# Patient Record
Sex: Female | Born: 2000 | Race: White | Hispanic: No | Marital: Single | Smoking: Never smoker
Health system: Southern US, Community
[De-identification: ages and names within clinical notes are randomized; demographics above are authoritative.]

## PROBLEM LIST (undated history)

## (undated) DIAGNOSIS — L659 Nonscarring hair loss, unspecified: Secondary | ICD-10-CM

## (undated) DIAGNOSIS — J45909 Unspecified asthma, uncomplicated: Secondary | ICD-10-CM

## (undated) DIAGNOSIS — J302 Other seasonal allergic rhinitis: Secondary | ICD-10-CM

## (undated) DIAGNOSIS — F4323 Adjustment disorder with mixed anxiety and depressed mood: Secondary | ICD-10-CM

## (undated) DIAGNOSIS — E663 Overweight: Secondary | ICD-10-CM

## (undated) HISTORY — DX: Overweight: E66.3

## (undated) HISTORY — DX: Unspecified asthma, uncomplicated: J45.909

## (undated) HISTORY — DX: Adjustment disorder with mixed anxiety and depressed mood: F43.23

## (undated) HISTORY — DX: Other seasonal allergic rhinitis: J30.2

---
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
## 2018-04-02 ENCOUNTER — Ambulatory Visit: Payer: BC Managed Care – PPO | Admitting: Pediatrics

## 2018-08-04 ENCOUNTER — Encounter: Payer: Self-pay | Admitting: Pediatrics

## 2018-09-14 ENCOUNTER — Ambulatory Visit: Payer: BC Managed Care – PPO | Admitting: Pediatrics

## 2018-09-15 ENCOUNTER — Encounter: Payer: Self-pay | Admitting: Pediatrics

## 2018-09-15 ENCOUNTER — Telehealth: Payer: Self-pay

## 2018-09-15 ENCOUNTER — Ambulatory Visit (INDEPENDENT_AMBULATORY_CARE_PROVIDER_SITE_OTHER): Payer: BC Managed Care – PPO | Admitting: Pediatrics

## 2018-09-15 VITALS — BP 130/70 | Ht 66.14 in | Wt 243.4 lb

## 2018-09-15 DIAGNOSIS — Z0001 Encounter for general adult medical examination with abnormal findings: Secondary | ICD-10-CM | POA: Diagnosis not present

## 2018-09-15 DIAGNOSIS — Z6839 Body mass index (BMI) 39.0-39.9, adult: Secondary | ICD-10-CM | POA: Diagnosis not present

## 2018-09-15 DIAGNOSIS — L659 Nonscarring hair loss, unspecified: Secondary | ICD-10-CM

## 2018-09-15 DIAGNOSIS — Z23 Encounter for immunization: Secondary | ICD-10-CM

## 2018-09-15 NOTE — Progress Notes (Signed)
[id number] Routine Well-Adolescent Visit  Amber Braun's personal or confidential phone number: [phone]  PCP: Amare Bail, Kyra Manges, MD   History was provided by the patient.  Amber Braun is a 17 y.o. female who is here for well exam.   Current concerns: has hair loss through the top of her head. Does not seem to be falling out. She had prior episode when she was in 10th grade due to stress, does not feel stressed now, has completed her first semester of college  No Known Allergies  Current Outpatient Medications on File Prior to Visit  Medication Sig Dispense Refill  . albuterol (PROVENTIL HFA;VENTOLIN HFA) 108 (90 Base) MCG/ACT inhaler Inhale 2 puffs into the lungs every 4 (four) hours as needed for wheezing. (Patient not taking: Reported on 12/01/2017) 1 Inhaler 2   No current facility-administered medications on file prior to visit.     Past Medical History:  Diagnosis Date  . Adjustment disorder with mixed anxiety and depressed mood 02/17/2013  . Adjustment disorder with mixed anxiety and depressed mood 02/17/2013  . Overweight(278.02) 02/17/2013  . Seasonal allergic rhinitis 02/17/2013  . Unspecified asthma(493.90) 02/17/2013    History reviewed. No pertinent surgical history.   ROS:     Constitutional  Afebrile, normal appetite, normal activity.   Opthalmologic  no irritation or drainage.   ENT  no rhinorrhea or congestion , no sore throat, no ear pain. Cardiovascular  No chest pain Respiratory  no cough , wheeze or chest pain.  Gastrointestinal  no abdominal pain, nausea or vomiting, bowel movements normal.     Genitourinary  no urgency, frequency or dysuria.   Musculoskeletal  no complaints of pain, no injuries.   Dermatologic  no rashes or lesions has hair thinning Neurologic - no significant history of headaches, no weakness  family history includes Cancer in her maternal grandfather; Diabetes in her paternal grandmother; Heart disease in her paternal grandfather and  paternal grandmother; Hypertension in her maternal grandfather and maternal grandmother.    Adolescent Assessment:  Confidentiality was discussed with the patient and if applicable, with caregiver as well.  Home and Environment:  Social History   Social History Narrative   Lives with parents, has older married sister   No smokers    Sports/Exercise:  regularly participates in sports -swims when she can  Education and Employment:  School Status: in  [employer] major School History: School attendance is regular. Work:  Activities:   parent not present confidentiality discussed:   Patient reports being comfortable and safe at school and at home? Yes  Smoking: no Secondhand smoke exposure? no Drugs/EtOH: no   Sexuality:  -Menarche: age - females:  last menses: 12/2  - Sexually active? no  - sexual partners in last year:  - contraception use: abstinence - Last STI Screening: 03/2017  - Violence/Abuse:   Mood: Suicidality and Depression: past history, doing well now Weapons:   Screenings:  PHQ-9 completed and results indicated no sigificant issues , score 3   Hearing Screening   '125Hz'$  '250Hz'$  '500Hz'$  '1000Hz'$  '2000Hz'$  '3000Hz'$  '4000Hz'$  '6000Hz'$  '8000Hz'$   Right ear:   '20 20 20 20 20    '$ Left ear:   '20 20 20 20 20      '$ Visual Acuity Screening   Right eye Left eye Both eyes  Without correction:     With correction: 20/20 20/20       Physical Exam:  BP 130/70   Ht 5' 6.14" (1.68 m)   Wt 243  lb 6.4 oz (110.4 kg)   BMI 39.12 kg/m   Weight: >99 %ile (Z= 2.42) based on CDC (Girls, 2-20 Years) weight-for-age data using vitals from 09/15/2018. Normalized weight-for-stature data available only for age 63 to 5 years.  Height: 77 %ile (Z= 0.73) based on CDC (Girls, 2-20 Years) Stature-for-age data based on Stature recorded on 09/15/2018.  Blood pressure percentiles are not available for patients who are 18 years or older.    Objective:         General alert in NAD  Derm    no rashes or lesions  Head Normocephalic, atraumatic                    Eyes Normal, no discharge  Ears:   TMs normal bilaterally  Nose:   patent normal mucosa, turbinates normal, no rhinorhea  Oral cavity  moist mucous membranes, no lesions  Throat:   normal tonsils, without exudate or erythema  Neck supple FROM  Lymph:   . no significant cervical adenopathy  Lungs:  clear with equal breath sounds bilaterally  Breast Tanner 5  Heart:   regular rate and rhythm, no murmur  Abdomen:  soft nontender no organomegaly or masses  GU:  normal female  back No deformity no scoliosis  Extremities:   no deformity,  Neuro:  intact no focal defects         Assessment/Plan:  1. Encounter for general adult medical examination with abnormal findings   2. Pediatric body mass index (BMI) of greater than or equal to 95th percentile for age Weight is stable congratulated her on avoiding the "freshman fifteen" has actually lost 4 # since last visit she will be swimming again next semester - Lipid panel - Hemoglobin A1c - TSH - Comprehensive metabolic panel - VITAMIN D 25 Hydroxy (Vit-D Deficiency, Fractures) - T4, free  3. 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 Need for vaccination  - Hepatitis A vaccine pediatric / adolescent 2 dose IM - HPV 9-valent vaccine,Recombinat - Meningococcal B, OMV (Bexsero) - Flu Vaccine QUAD 6+ mos PF IM (Fluarix Quad PF) - GC/Chlamydia Probe Amp([facility])  BMI: is not appropriate for age  Counseling completed for all of the following vaccine components  Orders Placed This Encounter  Procedures  . GC/Chlamydia Probe Amp([facility])  . Hepatitis A vaccine pediatric / adolescent 2 dose IM  . HPV 9-valent vaccine,Recombinat  . Meningococcal B, OMV (Bexsero)  . Flu Vaccine QUAD 6+ mos PF IM (Fluarix Quad PF)  . Lipid panel  . Hemoglobin A1c  . TSH  . Comprehensive metabolic  panel  . VITAMIN D 25 Hydroxy (Vit-D Deficiency, Fractures)  . T4, free  . CBC with Differential/Platelet  . Sed Rate (ESR)  . Ambulatory referral to Dermatology    Return in 6 months (on 03/17/2019).  Elizbeth Squires, MD

## 2018-09-15 NOTE — Patient Instructions (Signed)
Preventive Care for Amber Braun, Female The transition to life after high school as a young adult can be a stressful time with many changes. You may start seeing a primary care physician instead of a pediatrician. This is the time when your health care becomes your responsibility. Preventive care refers to lifestyle choices and visits with your health care provider that can promote health and wellness. What does preventive care include?  A yearly physical exam. This is also called an annual wellness visit.  Dental exams once or twice a year.  Routine eye exams. Ask your health care provider how often you should have your eyes checked.  Personal lifestyle choices, including: ? Daily care of your teeth and gums. ? Regular physical activity. ? Eating a healthy diet. ? Avoiding tobacco and drug use. ? Avoiding or limiting alcohol use. ? Practicing safe sex. ? Taking vitamin and mineral supplements as recommended by your health care provider. What happens during an annual wellness visit? Preventive care starts with a yearly visit to your primary care physician. The services and screenings done by your health care provider during your annual wellness visit will depend on your overall health, lifestyle risk factors, and family history of disease. Counseling Your health care provider may ask you questions about:  Past medical problems and your family's medical history.  Medicines or supplements you take.  Health insurance and access to health care.  Alcohol, tobacco, and drug use.  Your safety at home, work, or school.  Access to firearms.  Emotional well-being and how you cope with stress.  Relationship well-being.  Diet, exercise, and sleep habits.  Your sexual health and activity.  Your methods of birth control.  Your menstrual cycle.  Your pregnancy history.  Screening You may have the following tests or measurements:  Height, weight, and BMI.  Blood  pressure.  Lipid and cholesterol levels.  Tuberculosis skin test.  Skin exam.  Vision and hearing tests.  Screening test for hepatitis.  Screening tests for sexually transmitted diseases (STDs), if you are at risk.  BRCA-related cancer screening. This may be done if you have a family history of breast, ovarian, tubal, or peritoneal cancers.  Pelvic exam and Pap test. This may be done every 3 years starting at age 35.  Vaccines Your health care provider may recommend certain vaccines, such as:  Influenza vaccine. This is recommended every year.  Tetanus, diphtheria, and acellular pertussis (Tdap, Td) vaccine. You may need a Td booster every 10 years.  Varicella vaccine. You may need this if you have not been vaccinated.  HPV vaccine. If you are 25 or younger, you may need three doses over 6 months.  Measles, mumps, and rubella (MMR) vaccine. You may need at least one dose of MMR. You may also need a second dose.  Pneumococcal 13-valent conjugate (PCV13) vaccine. You may need this if you have certain conditions and were not previously vaccinated.  Pneumococcal polysaccharide (PPSV23) vaccine. You may need one or two doses if you smoke cigarettes or if you have certain conditions.  Meningococcal vaccine. One dose is recommended if you are age 24-21 years and a first-year college student living in a residence hall, or if you have one of several medical conditions. You may also need additional booster doses.  Hepatitis A vaccine. You may need this if you have certain conditions or if you travel or work in places where you may be exposed to hepatitis A.  Hepatitis B vaccine. You may need this if  you have certain conditions or if you travel or work in places where you may be exposed to hepatitis B.  Haemophilus influenzae type b (Hib) vaccine. You may need this if you have certain risk factors.  Talk to your health care provider about which screenings and vaccines you need and how  often you need them. What steps can I take to develop healthy behaviors?  Have regular preventive health care visits with your primary care physician and dentist.  Eat a healthy diet.  Drink enough fluid to keep your urine clear or pale yellow.  Stay active. Exercise at least 30 minutes 5 or more days of the week.  Use alcohol responsibly.  Maintain a healthy weight.  Do not use any products that contain nicotine, such as cigarettes, chewing tobacco, and e-cigarettes. If you need help quitting, ask your health care provider.  Do not use drugs.  Practice safe sex.  Use birth control (contraception) to prevent unwanted pregnancy. If you plan to become pregnant, see your health care provider for a pre-conception visit.  Find healthy ways to manage stress. How can I protect myself from injury? Injuries from violence or accidents are the leading cause of death among young adults and can often be prevented. Take these steps to help protect yourself:  Always wear your seat belt while driving or riding in a vehicle.  Do not drive if you have been drinking alcohol. Do not ride with someone who has been drinking.  Do not drive when you are tired or distracted. Do not text while driving.  Wear a helmet and other protective equipment during sports activities.  If you have firearms in your house, make sure you follow all gun safety procedures.  Seek help if you have been bullied, physically abused, or sexually abused.  Use the Internet responsibly to avoid dangers such as online bullying and online sexual predators.  What can I do to cope with stress? Young adults may face many new challenges that can be stressful, such as finding a job, going to college, moving away from home, managing money, being in a relationship, getting married, and having children. To manage stress:  Avoid known stressful situations when you can.  Exercise regularly.  Find a stress-reducing activity that  works best for you. Examples include meditation, yoga, listening to music, or reading.  Spend time in nature.  Keep a journal to write about your stress and how you respond.  Talk to your health care provider about stress. He or she may suggest counseling.  Spend time with supportive friends or family.  Do not cope with stress by: ? Drinking alcohol or using drugs. ? Smoking cigarettes. ? Eating.  Where can I get more information? Learn more about preventive care and healthy habits from:  [place] and Gynecologists: KaraokeExchange.nl  U.S. Probation officer Task Force: StageSync.si  National Adolescent and [place]: StrategicRoad.nl  American Academy of Pediatrics Bright Futures: https://brightfutures.MemberVerification.co.za  Society for Adolescent Health and Medicine: MoralBlog.co.za.aspx  PodExchange.nl: ToyLending.fr  This information is not intended to replace advice given to you by your health care provider. Make sure you discuss any questions you have with your health care provider. Document Released: 02/07/2016 Document Revised: 02/28/2016 Document Reviewed: 02/07/2016 Elsevier Interactive Patient Education  [employer].

## 2018-09-15 NOTE — Telephone Encounter (Signed)
CALLED Lamiah to let her know orders are ready for pickup so that she is able to get blood drawn for labs. Told her anytime after 2. Pt did understand.

## 2018-09-17 LAB — GC/CHLAMYDIA PROBE AMP
Chlamydia trachomatis, NAA: NEGATIVE
Neisseria gonorrhoeae by PCR: NEGATIVE

## 2018-09-18 LAB — COMPREHENSIVE METABOLIC PANEL
ALT: 21 IU/L (ref 0–32)
AST: 17 IU/L (ref 0–40)
Albumin/Globulin Ratio: 1.5 (ref 1.2–2.2)
Albumin: 3.9 g/dL (ref 3.5–5.5)
Alkaline Phosphatase: 66 IU/L (ref 43–101)
BUN/Creatinine Ratio: 15 (ref 9–23)
BUN: 12 mg/dL (ref 6–20)
Bilirubin Total: 0.3 mg/dL (ref 0.0–1.2)
CO2: 23 mmol/L (ref 20–29)
Calcium: 9.5 mg/dL (ref 8.7–10.2)
Chloride: 99 mmol/L (ref 96–106)
Creatinine, Ser: 0.81 mg/dL (ref 0.57–1.00)
Globulin, Total: 2.6 g/dL (ref 1.5–4.5)
Glucose: 79 mg/dL (ref 65–99)
Potassium: 4.2 mmol/L (ref 3.5–5.2)
Sodium: 138 mmol/L (ref 134–144)
Total Protein: 6.5 g/dL (ref 6.0–8.5)

## 2018-09-18 LAB — HEMOGLOBIN A1C
Est. average glucose Bld gHb Est-mCnc: 114 mg/dL
Hgb A1c MFr Bld: 5.6 % (ref 4.8–5.6)

## 2018-09-18 LAB — CBC
Hematocrit: 41.8 % (ref 34.0–46.6)
Hemoglobin: 13.1 g/dL (ref 11.1–15.9)
MCH: 24.7 pg — ABNORMAL LOW (ref 26.6–33.0)
MCHC: 31.3 g/dL — ABNORMAL LOW (ref 31.5–35.7)
MCV: 79 fL (ref 79–97)
Platelets: 359 10*3/uL (ref 150–450)
RBC: 5.3 x10E6/uL — ABNORMAL HIGH (ref 3.77–5.28)
RDW: 14.4 % (ref 12.3–15.4)
WBC: 8.1 10*3/uL (ref 3.4–10.8)

## 2018-09-18 LAB — VITAMIN D 25 HYDROXY (VIT D DEFICIENCY, FRACTURES): Vit D, 25-Hydroxy: 21.9 ng/mL — ABNORMAL LOW (ref 30.0–100.0)

## 2018-09-18 LAB — T4, FREE: Free T4: 1.22 ng/dL (ref 0.93–1.60)

## 2018-09-18 LAB — LIPID PANEL
Chol/HDL Ratio: 4 ratio (ref 0.0–4.4)
Cholesterol, Total: 158 mg/dL (ref 100–169)
HDL: 40 mg/dL (ref >39)
LDL Calculated: 103 mg/dL (ref 0–109)
Triglycerides: 73 mg/dL (ref 0–89)
VLDL Cholesterol Cal: 15 mg/dL (ref 5–40)

## 2018-09-18 LAB — TSH: TSH: 2.18 u[IU]/mL (ref 0.450–4.500)

## 2018-10-11 ENCOUNTER — Telehealth: Payer: Self-pay

## 2018-10-11 NOTE — Telephone Encounter (Signed)
Amber Braun called in wanting to know her lab results from back in December, told her everything looked normal except her vitamin D level, that she should take an OTC vitamin D supplement. Verbalized understanding.

## 2019-03-01 ENCOUNTER — Encounter: Payer: Self-pay | Admitting: "Endocrinology

## 2019-03-01 ENCOUNTER — Ambulatory Visit: Payer: BC Managed Care – PPO | Admitting: "Endocrinology

## 2019-03-01 ENCOUNTER — Other Ambulatory Visit: Payer: Self-pay

## 2019-03-01 VITALS — BP 107/75 | HR 82 | Ht 66.0 in | Wt 259.0 lb

## 2019-03-01 DIAGNOSIS — L659 Nonscarring hair loss, unspecified: Secondary | ICD-10-CM | POA: Insufficient documentation

## 2019-03-01 NOTE — Progress Notes (Signed)
Endocrinology Consult Note                                            03/01/2019, 2:21 PM   Subjective:    Patient ID: Amber Braun, female    DOB: 2001-02-27, PCP McDonell, Kyra Manges, MD   Past Medical History:  Diagnosis Date  . Adjustment disorder with mixed anxiety and depressed mood 02/17/2013  . Adjustment disorder with mixed anxiety and depressed mood 02/17/2013  . Overweight(278.02) 02/17/2013  . Seasonal allergic rhinitis 02/17/2013  . Unspecified asthma(493.90) 02/17/2013   History reviewed. No pertinent surgical history. Social History   Socioeconomic History  . Marital status: Single    Spouse name: Not on file  . Number of children: Not on file  . Years of education: Not on file  . Highest education level: Not on file  Occupational History  . Not on file  Social Needs  . Financial resource strain: Not on file  . Food insecurity:    Worry: Not on file    Inability: Not on file  . Transportation needs:    Medical: Not on file    Non-medical: Not on file  Tobacco Use  . Smoking status: Never Smoker  . Smokeless tobacco: Never Used  Substance and Sexual Activity  . Alcohol use: No  . Drug use: Not on file  . Sexual activity: Not on file  Lifestyle  . Physical activity:    Days per week: Not on file    Minutes per session: Not on file  . Stress: Not on file  Relationships  . Social connections:    Talks on phone: Not on file    Gets together: Not on file    Attends religious service: Not on file    Active member of club or organization: Not on file    Attends meetings of clubs or organizations: Not on file    Relationship status: Not on file  Other Topics Concern  . Not on file  Social History Narrative   Lives with parents, has older married sister   No smokers   Family History  Problem Relation Age of Onset  . Hypertension Maternal Grandmother   . Hypertension Maternal Grandfather   . Cancer Maternal Grandfather   . Diabetes  Paternal Grandmother   . Heart disease Paternal Grandmother   . Heart disease Paternal Grandfather    Outpatient Encounter Medications as of 03/01/2019  Medication Sig  . ferrous sulfate 325 (65 FE) MG EC tablet Take 325 mg by mouth daily with breakfast.  . fexofenadine (ALLEGRA) 180 MG tablet Take 180 mg by mouth daily.  Marland Kitchen spironolactone (ALDACTONE) 25 MG tablet Take 25 mg by mouth daily.  Matilde Bash 1/35 tablet daily.  Marland Kitchen albuterol (PROVENTIL HFA;VENTOLIN HFA) 108 (90 Base) MCG/ACT inhaler Inhale 2 puffs into the lungs every 4 (four) hours as needed for wheezing. (Patient not taking: Reported on 12/01/2017)   No facility-administered encounter medications on file as of 03/01/2019.    ALLERGIES: No Known Allergies  VACCINATION STATUS: Immunization History  Administered Date(s) Administered  . DTaP 12/02/1999, 01/30/2000, 04/01/2000, 10/07/2000, 05/15/2005  . HPV 9-valent 09/15/2018  . Hepatitis A 02/17/2017  . Hepatitis A, Ped/Adol-2 Dose 09/15/2018  . Hepatitis B Jan 11, 2001, 12/02/1999, 07/01/2000  . HiB (PRP-OMP) 12/02/1999, 01/30/2000, 04/01/2000, 10/07/2000  . Hpv 12/21/2017, 02/18/2018  .  IPV 12/02/1999, 01/30/2000, 04/30/2001, 05/15/2005  . Influenza Nasal 08/18/2005  . Influenza Whole 10/29/2006, 07/06/2008, 08/07/2009, 07/04/2010, 09/17/2011, 06/23/2012  . Influenza,inj,Quad PF,6+ Mos 09/15/2018  . MMR 10/07/2000, 05/15/2005  . Meningococcal B, OMV 09/15/2018  . Meningococcal Conjugate 03/18/2017  . Pneumococcal Conjugate-13 07/01/2000, 10/07/2000, 12/01/2001  . Td 04/23/2011  . Tdap 04/23/2011  . Varicella 04/27/2001, 03/18/2017    HPI Amber Braun is 18 y.o. female who presents today with a medical history as above. she is being seen in consultation for hair loss requested by McDonell, Kyra Manges, MD.  she has been dealing with hair loss for 2 to 3 years, loss of scalp hair on the top.  She has been following with dermatology where she is receiving prescription for  spironolactone.  Biopsy was performed on her scalp suggesting a possibility of androgenetic hair loss.  She does not have hormone work-up so far.  She is on oral contraceptive pills.  She reports that her hair loss is significantly slowing down after she was started on Spironolactone.  She denies any history of hirsutism.  Her menstrual cycles are normalized at this time.  She does not have any family history of endocrine malignancies.  -She has been gaining weight progressively, despite some efforts to manage her carbs.  Review of Systems  Constitutional: + recent weight gain, no fatigue, no subjective hyperthermia, no subjective hypothermia Eyes: no blurry vision, no xerophthalmia ENT: no sore throat, no nodules palpated in throat, no dysphagia/odynophagia, no hoarseness Cardiovascular: no Chest Pain, no Shortness of Breath, no palpitations, no leg swelling Respiratory: no cough, no shortness of breath Gastrointestinal: no Nausea/Vomiting/Diarhhea Musculoskeletal: no muscle/joint aches Skin: no rashes, + hair loss Neurological: no tremors, no numbness, no tingling, no dizziness Psychiatric: no depression, no anxiety  Objective:    BP 107/75   Pulse 82   Ht _0  (1.676 m)   Wt 259 lb (117.5 kg)   BMI 41.80 kg/m   Wt Readings from Last 3 Encounters:  03/01/19 259 lb (117.5 kg) (>99 %, Z= 2.57)*  09/15/18 243 lb 6.4 oz (110.4 kg) (>99 %, Z= 2.42)*  12/01/17 247 lb 4 oz (112.2 kg) (>99 %, Z= 2.43)*   * Growth percentiles are based on CDC (Girls, 2-20 Years) data.    Physical Exam  Constitutional:  + obese for height, not in acute distress, normal state of mind Eyes: PERRLA, EOMI, no exophthalmos ENT: moist mucous membranes, no gross thyromegaly, no gross cervical lymphadenopathy Cardiovascular: normal precordial activity, Regular Rate and Rhythm, no Murmur/Rubs/Gallops Respiratory:  adequate breathing efforts, no gross chest deformity, Clear to auscultation  bilaterally Gastrointestinal: abdomen soft, Non -tender, No distension, Bowel Sounds present, no gross organomegaly Musculoskeletal: no gross deformities, strength intact in all four extremities Skin: moist, warm, no rashes,  + thinned scalp hair on vertex. Neurological: no tremor with outstretched hands, Deep tendon reflexes normal in bilateral lower extremities.  CMP ( most recent) CMP     Component Value Date/Time   NA 138 09/17/2018 0849   K 4.2 09/17/2018 0849   CL 99 09/17/2018 0849   CO2 23 09/17/2018 0849   GLUCOSE 79 09/17/2018 0849   GLUCOSE 85 02/17/2013 0931   BUN 12 09/17/2018 0849   CREATININE 0.81 09/17/2018 0849   CREATININE 0.73 02/17/2013 0931   CALCIUM 9.5 09/17/2018 0849   PROT 6.5 09/17/2018 0849   ALBUMIN 3.9 09/17/2018 0849   AST 17 09/17/2018 0849   ALT 21 09/17/2018 0849   ALKPHOS 66 09/17/2018  0849   BILITOT 0.3 09/17/2018 0849     Diabetic Labs (most recent): Lab Results  Component Value Date   HGBA1C 5.6 09/17/2018   HGBA1C 5.4 05/06/2017   HGBA1C 5.4 02/17/2013     Lipid Panel ( most recent) Lipid Panel     Component Value Date/Time   CHOL 158 09/17/2018 0849   TRIG 73 09/17/2018 0849   HDL 40 09/17/2018 0849   CHOLHDL 4.0 09/17/2018 0849   LDLCALC 103 09/17/2018 0849      Lab Results  Component Value Date   TSH 2.180 09/17/2018   TSH 1.750 05/06/2017   TSH 1.046 02/17/2013   FREET4 1.22 09/17/2018   FREET4 1.28 02/17/2013      Assessment & Plan:   1. Hair loss - Amber Braun  is being seen at a kind request of McDonell, Kyra Manges, MD. - I have reviewed her available endocrine records and clinically evaluated the patient. - Based on these reviews, she does not have sufficient information to proceed with any definitive treatment plan.  She does not have frontal baldness nor temporal hair loss.  Although she is obese, she does not have features of PCOS.  -Subjectively, she describes response to treatment with  spironolactone as far as her hair loss.  She has normal menstrual cycles.  She is on oral contraceptive pills which will make it impractical to measure testosterone at this time.   -Suspicion for endocrine malignancy is low at this time.  However, I will proceed to obtain DHEA and DHEA-S on fasting morning sample of blood in the next few days.  She will return in 2 weeks for reevaluation.  She will not be sent for adrenal imaging at this time.  She would benefit from weight loss. - Patient admits there is a room for improvement in her diet and drink choices. -  Suggestion is made for her to avoid simple carbohydrates  from her diet including Cakes, Sweet Desserts / Pastries, Ice Cream, Soda (diet and regular), Sweet Tea, Candies, Chips, Cookies, Store Bought Juices, Alcohol in Excess of  1-2 drinks a day, Artificial Sweeteners, and "Sugar-free" Products. This will help patient to have stable blood glucose profile and potentially avoid unintended weight gain.  -If her labs are suggestive of adrenal origin hyperandrogenism, she will be considered for CT scan of abdomen/adrenals. -She is advised to continue spironolactone 25 mg p.o. daily.  - I did not initiate any new prescriptions today. - I advised her  to maintain close follow up with McDonell, Kyra Manges, MD for primary care needs.   - Time spent with the patient: 45 minutes, of which >50% was spent in obtaining information about her symptoms, reviewing her previous labs/studies,  evaluations, and treatments, counseling her about her hair loss, and developing a plan to confirm the diagnosis and long term treatment based on the latest standards of care/guidelines.    Amber Braun participated in the discussions, expressed understanding, and voiced agreement with the above plans.  All questions were answered to her satisfaction. she is encouraged to contact clinic should she have any questions or concerns prior to her return visit.  Follow up  plan: Return in about 2 weeks (around 03/15/2019), or 8AM fasting, for Follow up with Pre-visit Labs.   Glade Lloyd, MD [facility] [address] [place], [place] [address] Phone: [phone]  Fax: [phone]     03/01/2019, 2:21 PM  This note was partially dictated with  voice recognition software. Similar sounding words can be transcribed inadequately or may not  be corrected upon review.

## 2019-03-07 LAB — DHEA: DHEA: 408 ng/dL

## 2019-03-07 LAB — DHEA-SULFATE: DHEA-SO4: 229 ug/dL (ref 51–321)

## 2019-03-16 ENCOUNTER — Encounter: Payer: Self-pay | Admitting: "Endocrinology

## 2019-03-16 ENCOUNTER — Other Ambulatory Visit: Payer: Self-pay

## 2019-03-16 ENCOUNTER — Ambulatory Visit: Payer: BC Managed Care – PPO | Admitting: "Endocrinology

## 2019-03-16 ENCOUNTER — Ambulatory Visit (INDEPENDENT_AMBULATORY_CARE_PROVIDER_SITE_OTHER): Payer: BC Managed Care – PPO | Admitting: "Endocrinology

## 2019-03-16 DIAGNOSIS — L659 Nonscarring hair loss, unspecified: Secondary | ICD-10-CM | POA: Diagnosis not present

## 2019-03-16 MED ORDER — SPIRONOLACTONE 25 MG PO TABS: 25.0000 mg | ORAL_TABLET | Freq: Every day | ORAL | 4 refills | Status: AC

## 2019-03-16 NOTE — Progress Notes (Signed)
03/16/2019, 4:46 PM                                Endocrinology Telehealth Visit Follow up Note -During COVID -19 Pandemic  I connected with Amber Braun on 03/16/2019   by telephone and verified that I am speaking with the correct person using two identifiers. Amber Braun, 15-Apr-2001. she has verbally consented to this visit. All issues noted in this document were discussed and addressed. The format was not optimal for physical exam.      Subjective:    Patient ID: Amber Braun, female    DOB: 09/13/2001, PCP Patient, No Pcp Per   Past Medical History:  Diagnosis Date  . Adjustment disorder with mixed anxiety and depressed mood 02/17/2013  . Adjustment disorder with mixed anxiety and depressed mood 02/17/2013  . Overweight(278.02) 02/17/2013  . Seasonal allergic rhinitis 02/17/2013  . Unspecified asthma(493.90) 02/17/2013   History reviewed. No pertinent surgical history. Social History   Socioeconomic History  . Marital status: Single    Spouse name: Not on file  . Number of children: Not on file  . Years of education: Not on file  . Highest education level: Not on file  Occupational History  . Not on file  Social Needs  . Financial resource strain: Not on file  . Food insecurity:    Worry: Not on file    Inability: Not on file  . Transportation needs:    Medical: Not on file    Non-medical: Not on file  Tobacco Use  . Smoking status: Never Smoker  . Smokeless tobacco: Never Used  Substance and Sexual Activity  . Alcohol use: No  . Drug use: Not on file  . Sexual activity: Not on file  Lifestyle  . Physical activity:    Days per week: Not on file    Minutes per session: Not on file  . Stress: Not on file  Relationships  . Social connections:    Talks on phone: Not on file    Gets together: Not on file    Attends religious service: Not on file    Active member of club or organization: Not on file     Attends meetings of clubs or organizations: Not on file    Relationship status: Not on file  Other Topics Concern  . Not on file  Social History Narrative   Lives with parents, has older married sister   No smokers   Family History  Problem Relation Age of Onset  . Hypertension Maternal Grandmother   . Hypertension Maternal Grandfather   . Cancer Maternal Grandfather   . Diabetes Paternal Grandmother   . Heart disease Paternal Grandmother   . Heart disease Paternal Grandfather    Outpatient Encounter Medications as of 03/16/2019  Medication Sig  . ALAYCEN 1/35 tablet daily.  Marland Kitchen albuterol (PROVENTIL HFA;VENTOLIN HFA) 108 (90 Base) MCG/ACT inhaler Inhale 2 puffs into the lungs every 4 (four) hours as needed for wheezing. (Patient not taking: Reported on 12/01/2017)  . ferrous sulfate 325 (65 FE) MG EC tablet Take 325 mg by mouth daily with breakfast.  . fexofenadine (  ALLEGRA) 180 MG tablet Take 180 mg by mouth daily.  Marland Kitchen spironolactone (ALDACTONE) 25 MG tablet Take 1 tablet (25 mg total) by mouth daily.  . [DISCONTINUED] spironolactone (ALDACTONE) 25 MG tablet Take 25 mg by mouth daily.   No facility-administered encounter medications on file as of 03/16/2019.    ALLERGIES: No Known Allergies  VACCINATION STATUS: Immunization History  Administered Date(s) Administered  . DTaP 12/02/1999, 01/30/2000, 04/01/2000, 10/07/2000, 05/15/2005  . HPV 9-valent 09/15/2018  . Hepatitis A 02/17/2017  . Hepatitis A, Ped/Adol-2 Dose 09/15/2018  . Hepatitis B 03/15/1999, 12/02/1999, 07/01/2000  . HiB (PRP-OMP) 12/02/1999, 01/30/2000, 04/01/2000, 10/07/2000  . Hpv 12/21/2017, 02/18/2018  . IPV 12/02/1999, 01/30/2000, 04/30/2001, 05/15/2005  . Influenza Nasal 08/18/2005  . Influenza Whole 10/29/2006, 07/06/2008, 08/07/2009, 07/04/2010, 09/17/2011, 06/23/2012  . Influenza,inj,Quad PF,6+ Mos 09/15/2018  . MMR 10/07/2000, 05/15/2005  . Meningococcal B, OMV 09/15/2018  . Meningococcal Conjugate  03/18/2017  . Pneumococcal Conjugate-13 07/01/2000, 10/07/2000, 12/01/2001  . Td 04/23/2011  . Tdap 04/23/2011  . Varicella 04/27/2001, 03/18/2017    HPI Amber Braun is 18 y.o. female who was engaged in telehealth for follow-up after she was seen in consultation for hair loss.   Her previsit labs show normal levels of androgens from adreals.   she has been dealing with hair loss for 2 to 3 years, loss of scalp hair on the top.  She has been following with dermatology where she is receiving prescription for spironolactone.  Biopsy was performed on her scalp suggesting a possibility of androgenetic hair loss.     She is on oral contraceptive pills.  She reports that her hair loss is significantly slowing down after she was started on Spironolactone.   She has no new complaints today.  She denies any history of hirsutism.  Her menstrual cycles are normalized at this time.  She does not have any family history of endocrine malignancies.  -She has been gaining weight progressively, despite some efforts to manage her carbs.  Review of Systems Limited as above.  Objective:    There were no vitals taken for this visit.  Wt Readings from Last 3 Encounters:  03/01/19 259 lb (117.5 kg) (>99 %, Z= 2.57)*  09/15/18 243 lb 6.4 oz (110.4 kg) (>99 %, Z= 2.42)*  12/01/17 247 lb 4 oz (112.2 kg) (>99 %, Z= 2.43)*   * Growth percentiles are based on CDC (Girls, 2-20 Years) data.      CMP ( most recent) CMP     Component Value Date/Time   NA 138 09/17/2018 0849   K 4.2 09/17/2018 0849   CL 99 09/17/2018 0849   CO2 23 09/17/2018 0849   GLUCOSE 79 09/17/2018 0849   GLUCOSE 85 02/17/2013 0931   BUN 12 09/17/2018 0849   CREATININE 0.81 09/17/2018 0849   CREATININE 0.73 02/17/2013 0931   CALCIUM 9.5 09/17/2018 0849   PROT 6.5 09/17/2018 0849   ALBUMIN 3.9 09/17/2018 0849   AST 17 09/17/2018 0849   ALT 21 09/17/2018 0849   ALKPHOS 66 09/17/2018 0849   BILITOT 0.3 09/17/2018 0849      Diabetic Labs (most recent): Lab Results  Component Value Date   HGBA1C 5.6 09/17/2018   HGBA1C 5.4 05/06/2017   HGBA1C 5.4 02/17/2013     Lipid Panel ( most recent) Lipid Panel     Component Value Date/Time   CHOL 158 09/17/2018 0849   TRIG 73 09/17/2018 0849   HDL 40 09/17/2018 0849   CHOLHDL 4.0 09/17/2018 0849  [place] 103 09/17/2018 0849      Lab Results  Component Value Date   TSH 2.180 09/17/2018   TSH 1.750 05/06/2017   TSH 1.046 02/17/2013   FREET4 1.22 09/17/2018   FREET4 1.28 02/17/2013      Recent Results (from the past 2160 hour(s))  DHEA-sulfate     Status: None   Collection Time: 03/04/19  7:47 AM  Result Value Ref Range   DHEA-SO4 229 51 - 321 mcg/dL  DHEA     Status: None   Collection Time: 03/04/19  7:47 AM  Result Value Ref Range   DHEA 408 ng/dL    Comment: . Adult Female Reference Ranges .   Pre-Menopausal     Mid Follicular: 749-4496 ng/dL     Surge:          [id number] ng/dL     Mid Luteal:     [phone] ng/dL .   Post-Menopausal    77-851 ng/dL . This test was developed and its analytical performance characteristics have been determined by [facility]. It has not been cleared or approved by FDA. This assay has been validated pursuant to the CLIA regulations and is used for clinical purposes. .      Assessment & Plan:   1. Hair loss -Per her report, her hair loss is slowing down.  She has benefited from spironolactone treatment.  She does not have hyperandrogenism from adrenal source. -  She does not have frontal baldness nor temporal hair loss.  Although she is obese, she does not have features of PCOS.  -Subjectively, she describes response to treatment with spironolactone as far as her hair loss.  She has normal menstrual cycles.  She is on oral contraceptive pills which will make it impractical to measure testosterone at this time.   -Suspicion for endocrine malignancy is low  at this time.  She would benefit from weight loss. - Patient admits there is a room for improvement in her diet and drink choices. -  Suggestion is made for her to avoid simple carbohydrates  from her diet including Cakes, Sweet Desserts / Pastries, Ice Cream, Soda (diet and regular), Sweet Tea, Candies, Chips, Cookies, Store Bought Juices, Alcohol in Excess of  1-2 drinks a day, Artificial Sweeteners, and "Sugar-free" Products. This will help patient to have stable blood glucose profile and potentially avoid unintended weight gain.  -She will not require adrenal imaging at this time.  -She is advised to continue spironolactone 25 mg p.o. daily.  - I did not initiate any new prescriptions today. - I advised her  to maintain close follow up with PCP for primary care needs.  Visit 15 minutes.  Amber Braun participated in the discussions, expressed understanding, and voiced agreement with the above plans.  All questions were answered to her satisfaction. she is encouraged to contact clinic should she have any questions or concerns prior to her return visit.   Follow up plan: Return in about 1 year (around 03/15/2020) for Follow up with Pre-visit Labs.   Glade Lloyd, MD [facility] [address] [place], [place] [address] Phone: [phone]  Fax: [phone]     03/16/2019, 4:46 PM  This note was partially dictated with voice recognition software. Similar sounding words can be transcribed inadequately or may not  be corrected upon review.

## 2019-03-17 ENCOUNTER — Ambulatory Visit: Payer: BC Managed Care – PPO | Admitting: Pediatrics

## 2019-03-17 ENCOUNTER — Encounter: Payer: Self-pay | Admitting: Licensed Clinical Social Worker

## 2019-10-18 ENCOUNTER — Ambulatory Visit: Payer: BC Managed Care – PPO | Attending: Internal Medicine

## 2019-10-18 ENCOUNTER — Other Ambulatory Visit: Payer: Self-pay

## 2019-10-18 DIAGNOSIS — Z20822 Contact with and (suspected) exposure to covid-19: Secondary | ICD-10-CM

## 2019-10-19 LAB — NOVEL CORONAVIRUS, NAA: SARS-CoV-2, NAA: NOT DETECTED

## 2019-12-15 ENCOUNTER — Other Ambulatory Visit: Payer: Self-pay

## 2019-12-15 ENCOUNTER — Encounter (HOSPITAL_BASED_OUTPATIENT_CLINIC_OR_DEPARTMENT_OTHER): Payer: Self-pay | Admitting: Orthopaedic Surgery

## 2019-12-19 ENCOUNTER — Other Ambulatory Visit: Payer: Self-pay

## 2019-12-19 ENCOUNTER — Other Ambulatory Visit (HOSPITAL_BASED_OUTPATIENT_CLINIC_OR_DEPARTMENT_OTHER): Payer: BC Managed Care – PPO

## 2019-12-19 ENCOUNTER — Other Ambulatory Visit: Payer: BC Managed Care – PPO

## 2019-12-19 ENCOUNTER — Encounter (HOSPITAL_BASED_OUTPATIENT_CLINIC_OR_DEPARTMENT_OTHER)
Admission: RE | Admit: 2019-12-19 | Discharge: 2019-12-19 | Disposition: A | Discharge status: Home / Self Care | Payer: BC Managed Care – PPO | Source: Ambulatory Visit | Attending: Orthopaedic Surgery | Admitting: Orthopaedic Surgery

## 2019-12-19 ENCOUNTER — Other Ambulatory Visit (HOSPITAL_COMMUNITY)
Admission: RE | Admit: 2019-12-19 | Discharge: 2019-12-19 | Disposition: A | Discharge status: Home / Self Care | Payer: BC Managed Care – PPO | Source: Ambulatory Visit | Attending: Orthopaedic Surgery | Admitting: Orthopaedic Surgery

## 2019-12-19 DIAGNOSIS — Z20822 Contact with and (suspected) exposure to covid-19: Secondary | ICD-10-CM | POA: Insufficient documentation

## 2019-12-19 DIAGNOSIS — Z01812 Encounter for preprocedural laboratory examination: Secondary | ICD-10-CM | POA: Insufficient documentation

## 2019-12-19 LAB — POCT PREGNANCY, URINE: Preg Test, Ur: NEGATIVE

## 2019-12-19 LAB — BASIC METABOLIC PANEL
Anion gap: 7 (ref 5–15)
BUN: 11 mg/dL (ref 6–20)
CO2: 26 mmol/L (ref 22–32)
Calcium: 8.7 mg/dL — ABNORMAL LOW (ref 8.9–10.3)
Chloride: 106 mmol/L (ref 98–111)
Creatinine, Ser: 0.76 mg/dL (ref 0.44–1.00)
GFR calc Af Amer: >60 mL/min (ref >60)
GFR calc non Af Amer: >60 mL/min (ref >60)
Glucose, Bld: 83 mg/dL (ref 70–99)
Potassium: 4.3 mmol/L (ref 3.5–5.1)
Sodium: 139 mmol/L (ref 135–145)

## 2019-12-19 LAB — SARS CORONAVIRUS 2 (TAT 6-24 HRS): SARS Coronavirus 2: NEGATIVE

## 2019-12-19 NOTE — Progress Notes (Signed)

## 2019-12-20 NOTE — H&P (Signed)
PREOPERATIVE H&P  Chief Complaint: TEAR OF MENISCUS RIGHT KNEE,CHRONIC INSTABILITY OF KNEE  HPI: Amber Braun is a 19 y.o. female who is scheduled for KNEE ARTHROSCOPY WITH LATERAL MENISECTOMY MPFL RECONSTRUCTION.   Patient has a past medical history significant for asthma.   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  She has had patellar instability events for many years.  She reports 6-8 of these.  She has tried therapy and bracing and feels much better in a Shields brace.  She additionally has pain distal to her patellofemoral joint.  This is acute worsening of a chronic condition with the most recent dislocation on November 20, 2019 and she had to go to the emergency room.  Her symptoms are rated as moderate to severe, and have been worsening.  This is significantly impairing activities of daily living.    Please see clinic note for further details on this patient's care.    She has elected for surgical management.   Past Medical History:  Diagnosis Date  . Adjustment disorder with mixed anxiety and depressed mood 02/17/2013  . Alopecia   . Asthma   . Overweight(278.02) 02/17/2013  . Seasonal allergic rhinitis 02/17/2013  . Unspecified asthma(493.90) 02/17/2013   History reviewed. No pertinent surgical history. Social History   Socioeconomic History  . Marital status: Single    Spouse name: Not on file  . Number of children: Not on file  . Years of education: Not on file  . Highest education level: Not on file  Occupational History  . Not on file  Tobacco Use  . Smoking status: Never Smoker  . Smokeless tobacco: Never Used  Substance and Sexual Activity  . Alcohol use: No  . Drug use: Never  . Sexual activity: Not on file  Other Topics Concern  . Not on file  Social History Narrative   Lives with parents, has older married sister   No smokers   Social Determinants of Corporate investment banker  Strain:   . Difficulty of Paying Living Expenses:   Food Insecurity:   . Worried About Programme researcher, broadcasting/film/video in the Last Year:   . Barista in the Last Year:   Transportation Needs:   . Freight forwarder (Medical):   Marland Kitchen Lack of Transportation (Non-Medical):   Physical Activity:   . Days of Exercise per Week:   . Minutes of Exercise per Session:   Stress:   . Feeling of Stress :   Social Connections:   . Frequency of Communication with Friends and Family:   . Frequency of Social Gatherings with Friends and Family:   . Attends Religious Services:   . Active Member of Clubs or Organizations:   . Attends Banker Meetings:   Marland Kitchen Marital Status:    Family History  Problem Relation Age of Onset  . Hypertension Maternal Grandmother   . Hypertension Maternal Grandfather   . Cancer Maternal Grandfather   . Diabetes Paternal Grandmother   . Heart disease Paternal Grandmother   . Heart disease Paternal Grandfather    No Known Allergies Prior to Admission medications   Medication Sig Start Date End Date Taking? Authorizing Provider  ALAYCEN 1/35 tablet daily. 02/15/19  Yes [provider]  ferrous sulfate 325 (65 FE) MG EC tablet Take 325 mg by mouth daily with breakfast.   Yes [provider]  fexofenadine (  ALLEGRA) 180 MG tablet Take 180 mg by mouth daily.   Yes [provider]  spironolactone (ALDACTONE) 25 MG tablet Take 1 tablet (25 mg total) by mouth daily. 03/16/19  Yes Cassandria Anger, MD    ROS: All other systems have been reviewed and were otherwise negative with the exception of those mentioned in the HPI and as above.  Physical Exam: General: Alert, no acute distress Cardiovascular: No pedal edema Respiratory: No cyanosis, no use of accessory musculature GI: No organomegaly, abdomen is soft and non-tender Skin: No lesions in the area of chief complaint Neurologic: Sensation intact distally Psychiatric: Patient is  competent for consent with normal mood and affect Lymphatic: No axillary or cervical lymphadenopathy  MUSCULOSKELETAL:  Right knee: patella apprehension. ROM from  -10 to 135 degrees, which is symp.  No obvious effusion.  She is tender to palpation about the anterolateral joint line as well.    Imaging: MRI reviewed in [place] done at an outside facility which demonstrates a Dejour Type A trochlea, 1.36 Caton-Deschamps ratio and a 14 TT-TG.  There is no obvious sign of patellar dislocation acutely.  She does have a parameniscal cyst anterolateral, but no obvious meniscal tear noted.     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    Plan: Plan for Procedure(s): KNEE ARTHROSCOPY WITH LATERAL MENISECTOMY MPFL RECONSTRUCTION  Dr. Griffin Basil and the patient discussed a right knee arthroscopy with debridement of parameniscal cyst, meniscectomy, and MPFL reconstruction.   The risks benefits and alternatives were discussed with the patient including but not limited to the risks of nonoperative treatment, versus surgical intervention including infection, bleeding, nerve injury,  blood clots, cardiopulmonary complications, morbidity, mortality, among others, and they were willing to proceed.   We additionally discussed the risks including continued instability and stiffness.   The patient acknowledged the explanation, agreed to proceed with the plan and consent was signed.   Operative Plan: right knee arthroscopy with debridement of parameniscal cyst, meniscectomy, and MPFL reconstruction.  Discharge Medications: Standard DVT Prophylaxis: Aspirin Physical Therapy: Outpatient PT Special Discharge needs: Knee immobilizer   Ethelda Chick, PA-C  12/20/2019 7:48 AM

## 2019-12-22 ENCOUNTER — Other Ambulatory Visit: Payer: Self-pay

## 2019-12-22 ENCOUNTER — Ambulatory Visit (HOSPITAL_BASED_OUTPATIENT_CLINIC_OR_DEPARTMENT_OTHER)
Admission: RE | Admit: 2019-12-22 | Discharge: 2019-12-22 | Disposition: A | Discharge status: Home / Self Care | Payer: BC Managed Care – PPO | Attending: Orthopaedic Surgery | Admitting: Orthopaedic Surgery

## 2019-12-22 ENCOUNTER — Encounter (HOSPITAL_BASED_OUTPATIENT_CLINIC_OR_DEPARTMENT_OTHER)
Admission: RE | Disposition: A | Discharge status: Home / Self Care | Payer: Self-pay | Source: Home / Self Care | Attending: Orthopaedic Surgery

## 2019-12-22 ENCOUNTER — Ambulatory Visit (HOSPITAL_BASED_OUTPATIENT_CLINIC_OR_DEPARTMENT_OTHER): Payer: BC Managed Care – PPO | Admitting: Certified Registered"

## 2019-12-22 ENCOUNTER — Encounter (HOSPITAL_BASED_OUTPATIENT_CLINIC_OR_DEPARTMENT_OTHER): Payer: Self-pay | Admitting: Orthopaedic Surgery

## 2019-12-22 ENCOUNTER — Ambulatory Visit (HOSPITAL_COMMUNITY): Payer: BC Managed Care – PPO

## 2019-12-22 DIAGNOSIS — E669 Obesity, unspecified: Secondary | ICD-10-CM | POA: Insufficient documentation

## 2019-12-22 DIAGNOSIS — J45909 Unspecified asthma, uncomplicated: Secondary | ICD-10-CM | POA: Insufficient documentation

## 2019-12-22 DIAGNOSIS — M2351 Chronic instability of knee, right knee: Secondary | ICD-10-CM | POA: Diagnosis not present

## 2019-12-22 DIAGNOSIS — Z79899 Other long term (current) drug therapy: Secondary | ICD-10-CM | POA: Insufficient documentation

## 2019-12-22 DIAGNOSIS — Z8249 Family history of ischemic heart disease and other diseases of the circulatory system: Secondary | ICD-10-CM | POA: Insufficient documentation

## 2019-12-22 DIAGNOSIS — F419 Anxiety disorder, unspecified: Secondary | ICD-10-CM | POA: Diagnosis not present

## 2019-12-22 DIAGNOSIS — Z419 Encounter for procedure for purposes other than remedying health state, unspecified: Secondary | ICD-10-CM

## 2019-12-22 DIAGNOSIS — Z809 Family history of malignant neoplasm, unspecified: Secondary | ICD-10-CM | POA: Insufficient documentation

## 2019-12-22 DIAGNOSIS — Z6837 Body mass index (BMI) 37.0-37.9, adult: Secondary | ICD-10-CM | POA: Diagnosis not present

## 2019-12-22 DIAGNOSIS — F329 Major depressive disorder, single episode, unspecified: Secondary | ICD-10-CM | POA: Insufficient documentation

## 2019-12-22 DIAGNOSIS — Z833 Family history of diabetes mellitus: Secondary | ICD-10-CM | POA: Insufficient documentation

## 2019-12-22 HISTORY — PX: KNEE ARTHROSCOPY WITH MEDIAL PATELLAR FEMORAL LIGAMENT RECONSTRUCTION: SHX5652

## 2019-12-22 HISTORY — DX: Unspecified asthma, uncomplicated: J45.909

## 2019-12-22 HISTORY — DX: Nonscarring hair loss, unspecified: L65.9

## 2019-12-22 HISTORY — PX: KNEE ARTHROSCOPY WITH LATERAL MENISECTOMY: SHX6193

## 2019-12-22 SURGERY — ARTHROSCOPY, KNEE, WITH LATERAL MENISCECTOMY
Anesthesia: General | Site: Knee | Laterality: Right

## 2019-12-22 MED ORDER — CEFAZOLIN SODIUM-DEXTROSE 2-4 GM/100ML-% IV SOLN
2.0000 g | INTRAVENOUS | Status: AC
  Administered 2019-12-22: 2 g via INTRAVENOUS

## 2019-12-22 MED ORDER — VANCOMYCIN HCL 500 MG IV SOLR
INTRAVENOUS | Status: AC
  Filled 2019-12-22: qty 1000

## 2019-12-22 MED ORDER — MIDAZOLAM HCL 2 MG/2ML IJ SOLN
INTRAMUSCULAR | Status: AC
  Filled 2019-12-22: qty 2

## 2019-12-22 MED ORDER — MIDAZOLAM HCL 2 MG/2ML IJ SOLN
1.0000 mg | INTRAMUSCULAR | Status: DC | PRN
  Administered 2019-12-22: 2 mg via INTRAVENOUS

## 2019-12-22 MED ORDER — EPHEDRINE 5 MG/ML INJ
INTRAVENOUS | Status: AC
  Filled 2019-12-22: qty 30

## 2019-12-22 MED ORDER — MELOXICAM 7.5 MG PO TABS: 7.5000 mg | ORAL_TABLET | Freq: Every day | ORAL | 2 refills | Status: AC

## 2019-12-22 MED ORDER — EPHEDRINE SULFATE 50 MG/ML IJ SOLN
INTRAMUSCULAR | Status: DC | PRN
  Administered 2019-12-22: 10 mg via INTRAVENOUS

## 2019-12-22 MED ORDER — FENTANYL CITRATE (PF) 100 MCG/2ML IJ SOLN
50.0000 ug | INTRAMUSCULAR | Status: DC | PRN
  Administered 2019-12-22: 50 ug via INTRAVENOUS

## 2019-12-22 MED ORDER — FENTANYL CITRATE (PF) 100 MCG/2ML IJ SOLN
INTRAMUSCULAR | Status: DC | PRN
  Administered 2019-12-22: 25 ug via INTRAVENOUS
  Administered 2019-12-22: 50 ug via INTRAVENOUS
  Administered 2019-12-22: 25 ug via INTRAVENOUS

## 2019-12-22 MED ORDER — DEXAMETHASONE SODIUM PHOSPHATE 10 MG/ML IJ SOLN
INTRAMUSCULAR | Status: DC | PRN
  Administered 2019-12-22: 5 mg via INTRAVENOUS

## 2019-12-22 MED ORDER — PROPOFOL 10 MG/ML IV BOLUS
INTRAVENOUS | Status: DC | PRN
  Administered 2019-12-22: 200 mg via INTRAVENOUS

## 2019-12-22 MED ORDER — FENTANYL CITRATE (PF) 100 MCG/2ML IJ SOLN
25.0000 ug | INTRAMUSCULAR | Status: DC | PRN
  Administered 2019-12-22: 50 ug via INTRAVENOUS

## 2019-12-22 MED ORDER — OXYCODONE HCL 5 MG PO TABS: ORAL_TABLET | ORAL | 0 refills | Status: AC

## 2019-12-22 MED ORDER — CHLORHEXIDINE GLUCONATE 4 % EX LIQD: 60.0000 mL | Freq: Once | CUTANEOUS | Status: DC

## 2019-12-22 MED ORDER — VANCOMYCIN HCL 1 G IV SOLR
INTRAVENOUS | Status: DC | PRN
  Administered 2019-12-22: 1000 mg via TOPICAL

## 2019-12-22 MED ORDER — FENTANYL CITRATE (PF) 100 MCG/2ML IJ SOLN
INTRAMUSCULAR | Status: AC
  Filled 2019-12-22: qty 2

## 2019-12-22 MED ORDER — EPHEDRINE 5 MG/ML INJ
INTRAVENOUS | Status: AC
  Filled 2019-12-22: qty 10

## 2019-12-22 MED ORDER — SODIUM CHLORIDE 0.9 % IR SOLN
Status: DC | PRN
  Administered 2019-12-22: 3000 mL

## 2019-12-22 MED ORDER — ACETAMINOPHEN 500 MG PO TABS: 1000.0000 mg | ORAL_TABLET | Freq: Three times a day (TID) | ORAL | 0 refills | Status: AC

## 2019-12-22 MED ORDER — ACETAMINOPHEN 500 MG PO TABS
1000.0000 mg | ORAL_TABLET | Freq: Once | ORAL | Status: AC
  Administered 2019-12-22: 1000 mg via ORAL

## 2019-12-22 MED ORDER — GLYCOPYRROLATE 0.2 MG/ML IJ SOLN
INTRAMUSCULAR | Status: DC | PRN
  Administered 2019-12-22 (×2): .1 mg via INTRAVENOUS

## 2019-12-22 MED ORDER — PROMETHAZINE HCL 25 MG/ML IJ SOLN: 6.2500 mg | INTRAMUSCULAR | Status: DC | PRN

## 2019-12-22 MED ORDER — ONDANSETRON HCL 4 MG/2ML IJ SOLN
INTRAMUSCULAR | Status: DC | PRN
  Administered 2019-12-22: 4 mg via INTRAVENOUS

## 2019-12-22 MED ORDER — GLYCOPYRROLATE PF 0.2 MG/ML IJ SOSY
PREFILLED_SYRINGE | INTRAMUSCULAR | Status: AC
  Filled 2019-12-22: qty 1

## 2019-12-22 MED ORDER — ACETAMINOPHEN 500 MG PO TABS
ORAL_TABLET | ORAL | Status: AC
  Filled 2019-12-22: qty 2

## 2019-12-22 MED ORDER — ONDANSETRON HCL 4 MG PO TABS: 4.0000 mg | ORAL_TABLET | Freq: Three times a day (TID) | ORAL | 1 refills | Status: AC | PRN

## 2019-12-22 MED ORDER — CEFAZOLIN SODIUM-DEXTROSE 2-4 GM/100ML-% IV SOLN
INTRAVENOUS | Status: AC
  Filled 2019-12-22: qty 100

## 2019-12-22 MED ORDER — ASPIRIN 81 MG PO CHEW: 81.0000 mg | CHEWABLE_TABLET | Freq: Two times a day (BID) | ORAL | 0 refills | Status: AC

## 2019-12-22 MED ORDER — LACTATED RINGERS IV SOLN: INTRAVENOUS | Status: DC

## 2019-12-22 MED ORDER — LIDOCAINE HCL (CARDIAC) PF 100 MG/5ML IV SOSY
PREFILLED_SYRINGE | INTRAVENOUS | Status: DC | PRN
  Administered 2019-12-22: 30 mg via INTRAVENOUS

## 2019-12-22 MED ORDER — PROPOFOL 500 MG/50ML IV EMUL
INTRAVENOUS | Status: DC | PRN
  Administered 2019-12-22: 25 ug/kg/min via INTRAVENOUS

## 2019-12-22 SURGICAL SUPPLY — 80 items
ANCH SUT 2 SUTTK 12X2.4 STRL (Anchor) ×2 IMPLANT
ANCH SUT SWLK 19.1X6.25 CLS (Anchor) ×1 IMPLANT
ANCHOR SUT SWIVELLOK BIO (Anchor) ×2 IMPLANT
ANCHOR SUTURETAK 2.4X12 BIOC # (Anchor) ×4 IMPLANT
APL PRP STRL LF DISP 70% ISPRP (MISCELLANEOUS) ×1
BLADE HEX COATED 2.75 (ELECTRODE) ×3 IMPLANT
BLADE SHAVER BONE 5.0MM X 13CM (MISCELLANEOUS) ×1
BLADE SHAVER BONE 5.0X13 (MISCELLANEOUS) ×1 IMPLANT
BLADE SURG 10 STRL SS (BLADE) IMPLANT
BLADE SURG 15 STRL LF DISP TIS (BLADE) ×1 IMPLANT
BLADE SURG 15 STRL SS (BLADE) ×3
BNDG COHESIVE 4X5 TAN STRL (GAUZE/BANDAGES/DRESSINGS) ×3 IMPLANT
BNDG ELASTIC 6X5.8 VLCR STR LF (GAUZE/BANDAGES/DRESSINGS) ×3 IMPLANT
BURR OVAL 8 FLU 4.0MM X 13CM (MISCELLANEOUS)
BURR OVAL 8 FLU 4.0X13 (MISCELLANEOUS) IMPLANT
CHLORAPREP W/TINT 26 (MISCELLANEOUS) ×3 IMPLANT
CLOSURE STERI-STRIP 1/2X4 (GAUZE/BANDAGES/DRESSINGS) ×1
CLSR STERI-STRIP ANTIMIC 1/2X4 (GAUZE/BANDAGES/DRESSINGS) ×2 IMPLANT
COVER BACK TABLE 60X90IN (DRAPES) ×3 IMPLANT
COVER WAND RF STERILE (DRAPES) IMPLANT
CUFF TOURN SGL QUICK 34 (TOURNIQUET CUFF) ×3
CUFF TRNQT CYL 34X4.125X (TOURNIQUET CUFF) ×1 IMPLANT
DISSECTOR 3.5MM X 13CM CVD (MISCELLANEOUS) ×3 IMPLANT
DISSECTOR 4.0MMX13CM CVD (MISCELLANEOUS) IMPLANT
DRAPE ARTHROSCOPY W/POUCH 90 (DRAPES) ×3 IMPLANT
DRAPE C-ARM 42X72 X-RAY (DRAPES) ×3 IMPLANT
DRAPE C-ARMOR (DRAPES) ×3 IMPLANT
DRAPE IMP U-DRAPE 54X76 (DRAPES) ×3 IMPLANT
DRAPE TOP ARMCOVERS (MISCELLANEOUS) ×3 IMPLANT
DRAPE U-SHAPE 47X51 STRL (DRAPES) ×3 IMPLANT
DRSG EMULSION OIL 3X3 NADH (GAUZE/BANDAGES/DRESSINGS) IMPLANT
ELECT REM PT RETURN 9FT ADLT (ELECTROSURGICAL) ×3
ELECTRODE REM PT RTRN 9FT ADLT (ELECTROSURGICAL) ×1 IMPLANT
GAUZE SPONGE 4X4 12PLY STRL (GAUZE/BANDAGES/DRESSINGS) ×6 IMPLANT
GLOVE BIO SURGEON STRL SZ 6.5 (GLOVE) ×2 IMPLANT
GLOVE BIO SURGEONS STRL SZ 6.5 (GLOVE) ×1
GLOVE BIOGEL PI IND STRL 6.5 (GLOVE) ×1 IMPLANT
GLOVE BIOGEL PI IND STRL 8 (GLOVE) ×1 IMPLANT
GLOVE BIOGEL PI INDICATOR 6.5 (GLOVE) ×2
GLOVE BIOGEL PI INDICATOR 8 (GLOVE) ×2
GLOVE ECLIPSE 8.0 STRL XLNG CF (GLOVE) ×3 IMPLANT
GOWN STRL REUS W/ TWL LRG LVL3 (GOWN DISPOSABLE) ×2 IMPLANT
GOWN STRL REUS W/ TWL XL LVL3 (GOWN DISPOSABLE) ×1 IMPLANT
GOWN STRL REUS W/TWL LRG LVL3 (GOWN DISPOSABLE) ×6
GOWN STRL REUS W/TWL XL LVL3 (GOWN DISPOSABLE) ×6 IMPLANT
GRAFT TISS SEMITEND 4-8 (Bone Implant) IMPLANT
IMMOBILIZER KNEE 22 UNIV (SOFTGOODS) IMPLANT
IMMOBILIZER KNEE 24 THIGH 36 (MISCELLANEOUS) IMPLANT
IMMOBILIZER KNEE 24 UNIV (MISCELLANEOUS)
KIT BIO-SUTURETAK 2.4 SPR TROC (KITS) ×2 IMPLANT
KIT SUTURETAK 3 SPEAR TROCAR (KITS) IMPLANT
KIT TRANSTIBIAL (DISPOSABLE) ×3 IMPLANT
KNEE WRAP E Z 3 GEL PACK (MISCELLANEOUS) ×2 IMPLANT
MANIFOLD NEPTUNE II (INSTRUMENTS) ×3 IMPLANT
NDL SAFETY ECLIPSE 18X1.5 (NEEDLE) ×1 IMPLANT
NDL SUT 6 .5 CRC .975X.05 MAYO (NEEDLE) IMPLANT
NEEDLE HYPO 18GX1.5 SHARP (NEEDLE) ×3
NEEDLE MAYO TAPER (NEEDLE)
PACK ARTHROSCOPY DSU (CUSTOM PROCEDURE TRAY) ×3 IMPLANT
PACK BASIN DAY SURGERY FS (CUSTOM PROCEDURE TRAY) ×3 IMPLANT
PENCIL SMOKE EVACUATOR (MISCELLANEOUS) ×3 IMPLANT
PORT APPOLLO RF 90DEGREE MULTI (SURGICAL WAND) IMPLANT
SHEET MEDIUM DRAPE 40X70 STRL (DRAPES) ×3 IMPLANT
SPONGE LAP 4X18 RFD (DISPOSABLE) IMPLANT
SUT FIBERWIRE #2 38 T-5 BLUE (SUTURE)
SUT MNCRL AB 4-0 PS2 18 (SUTURE) ×3 IMPLANT
SUT VIC AB 0 CT1 27 (SUTURE) ×3
SUT VIC AB 0 CT1 27XBRD ANBCTR (SUTURE) ×1 IMPLANT
SUT VIC AB 3-0 SH 27 (SUTURE) ×6
SUT VIC AB 3-0 SH 27X BRD (SUTURE) ×1 IMPLANT
SUTURE FIBERWR #2 38 T-5 BLUE (SUTURE) IMPLANT
SUTURE TAPE 1.3 FIBERLOP 20 ST (SUTURE) IMPLANT
SUTURETAPE 1.3 FIBERLOOP 20 ST (SUTURE)
SYR 5ML LL (SYRINGE) ×3 IMPLANT
TAPE CLOTH 3X10 TAN LF (GAUZE/BANDAGES/DRESSINGS) IMPLANT
TENDON SEMI-TENDINOSUS (Bone Implant) ×3 IMPLANT
TOWEL GREEN STERILE FF (TOWEL DISPOSABLE) ×3 IMPLANT
TUBE SUCTION HIGH CAP CLEAR NV (SUCTIONS) ×3 IMPLANT
TUBING ARTHROSCOPY IRRIG 16FT (MISCELLANEOUS) ×3 IMPLANT
WATER STERILE IRR 1000ML POUR (IV SOLUTION) ×3 IMPLANT

## 2019-12-22 NOTE — Anesthesia Postprocedure Evaluation (Signed)
Anesthesia Post Note  Patient: Amber Braun  Procedure(s) Performed: KNEE ARTHROSCOPY WITH LATERAL MENISECTOMY (Right Knee) KNEE ARTHROSCOPY WITH MEDIAL PATELLAR FEMORAL LIGAMENT RECONSTRUCTION (Right Knee)     Patient location during evaluation: PACU Anesthesia Type: General Level of consciousness: awake and alert Pain management: pain level controlled Vital Signs Assessment: post-procedure vital signs reviewed and stable Respiratory status: spontaneous breathing, nonlabored ventilation and respiratory function stable Cardiovascular status: blood pressure returned to baseline and stable Postop Assessment: no apparent nausea or vomiting Anesthetic complications: no    Last Vitals:  Vitals:   12/22/19 1245 12/22/19 1343  BP: 119/73 111/71  Pulse: 84 89  Resp: 13 16  Temp:  37.1 C  SpO2: 99% 100%    Last Pain:  Vitals:   12/22/19 1343  TempSrc: Oral  PainSc: 0-No pain                 Cecile Hearing

## 2019-12-22 NOTE — Discharge Instructions (Signed)
No Tylenol until 4pm   Post Anesthesia Home Care Instructions  Activity: Get plenty of rest for the remainder of the day. A responsible individual must stay with you for 24 hours following the procedure.  For the next 24 hours, DO NOT: -Drive a car -Advertising copywriter -Drink alcoholic beverages -Take any medication unless instructed by your physician -Make any legal decisions or sign important papers.  Meals: Start with liquid foods such as gelatin or soup. Progress to regular foods as tolerated. Avoid greasy, spicy, heavy foods. If nausea and/or vomiting occur, drink only clear liquids until the nausea and/or vomiting subsides. Call your physician if vomiting continues.  Special Instructions/Symptoms: Your throat may feel dry or sore from the anesthesia or the breathing tube placed in your throat during surgery. If this causes discomfort, gargle with warm salt water. The discomfort should disappear within 24 hours.  If you had a scopolamine patch placed behind your ear for the management of post- operative nausea and/or vomiting:  1. The medication in the patch is effective for 72 hours, after which it should be removed.  Wrap patch in a tissue and discard in the trash. Wash hands thoroughly with soap and water. 2. You may remove the patch earlier than 72 hours if you experience unpleasant side effects which may include dry mouth, dizziness or visual disturbances. 3. Avoid touching the patch. Wash your hands with soap and water after contact with the patch.    Regional Anesthesia Blocks  1. Numbness or the inability to move the "blocked" extremity may last from 3-48 hours after placement. The length of time depends on the medication injected and your individual response to the medication. If the numbness is not going away after 48 hours, call your surgeon.  2. The extremity that is blocked will need to be protected until the numbness is gone and the  Strength has returned. Because you  cannot feel it, you will need to take extra care to avoid injury. Because it may be weak, you may have difficulty moving it or using it. You may not know what position it is in without looking at it while the block is in effect.  3. For blocks in the legs and feet, returning to weight bearing and walking needs to be done carefully. You will need to wait until the numbness is entirely gone and the strength has returned. You should be able to move your leg and foot normally before you try and bear weight or walk. You will need someone to be with you when you first try to ensure you do not fall and possibly risk injury.  4. Bruising and tenderness at the needle site are common side effects and will resolve in a few days.  5. Persistent numbness or new problems with movement should be communicated to the surgeon or the [facility] [phone] [facility] [phone]).

## 2019-12-22 NOTE — Transfer of Care (Signed)
Immediate Anesthesia Transfer of Care Note  Patient: Amber Braun  Procedure(s) Performed: KNEE ARTHROSCOPY WITH LATERAL MENISECTOMY (Right Knee) KNEE ARTHROSCOPY WITH MEDIAL PATELLAR FEMORAL LIGAMENT RECONSTRUCTION (Right Knee)  Patient Location: PACU  Anesthesia Type:GA combined with regional for post-op pain  Level of Consciousness: awake, alert , oriented and patient cooperative  Airway & Oxygen Therapy: Patient Spontanous Breathing and Patient connected to face mask oxygen  Post-op Assessment: Report given to RN and Post -op Vital signs reviewed and stable  Post vital signs: Reviewed and stable  Last Vitals:  Vitals Value Taken Time  BP 126/74 12/22/19 1203  Temp    Pulse 113 12/22/19 1204  Resp 21 12/22/19 1204  SpO2 100 % 12/22/19 1204  Vitals shown include unvalidated device data.  Last Pain:  Vitals:   12/22/19 0831  TempSrc: Oral  PainSc: 2       Patients Stated Pain Goal: 4 (12/22/19 0831)  Complications: No apparent anesthesia complications

## 2019-12-22 NOTE — Op Note (Signed)
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  We probed both underside and superior side and though there was some mild fraying there was really no obvious tear. Intercondylar Notch: Normal  Successful completion of the planned procedure.  Meniscus appeared normal and we left it alone.  MPFL reconstruction was robust with a 6.5 mm graft.  Post-operative plan: The patient will be weightbearing to tolerance in a knee immobilizer locked straight on standard protocol.  The patient will be discharged home.  DVT prophylaxis Aspirin 81 mg twice daily for 6 weeks.  Pain control with PRN pain medication preferring oral medicines.  Follow up plan will be scheduled in approximately 7 days for incision check and XR.  Post-Op Diagnosis: Same Surgeons:Primary: Bjorn Pippin, MD Assistants:Caroline McBane PA-C Location: [facility] OR ROOM 6 Anesthesia: General with abductor canal block Antibiotics: Ancef 2 g with local vancomycin powder 1 g at the surgical site Tourniquet time:  Total Tourniquet Time Documented: Thigh (Right) - 56 minutes Total: Thigh (Right) - 56 minutes  Estimated Blood Loss: Minimal Complications: None Specimens: None Implants: Implant Name Type Inv. Item Serial No. Manufacturer Lot No. LRB No. Used Action  TENDON SEMI-TENDINOSUS - [id number]  Bone Implant TENDON SEMI-TENDINOSUS [id number] [facility] [id number] Right 1 Implanted  [address] BIO - [id number] Anchor [address] [employer] [id number] Right 1 Implanted  [employer] - [id number] Anchor [employer]K [employer]  [employer] [id number] Right 1 Implanted  SUTRURETAK BIOCOMPOSITE - [id number] Anchor [employer]K [employer]  [employer] [id number] Right 1 Implanted    Indications for Surgery:   JADELIN ENG is a 19 y.o. female with reported continued patellofemoral subluxations and instability.  Benefits and risks of operative and nonoperative management were discussed prior to surgery with patient/guardian(s) and informed consent form was completed.  Specific risks including infection, need for additional surgery, continued patellofemoral instability, stiffness, extended recovery, patellofemoral arthritis   Procedure:   The patient was identified properly. Informed consent was obtained and the surgical site was marked. The patient was taken up to suite where general anesthesia was induced. The patient was placed in the supine position with a post against the surgical leg and a nonsterile tourniquet applied. The surgical leg was then prepped and draped usual sterile fashion.  A standard surgical timeout was performed.  2 standard anterior portals were made and diagnostic arthroscopy performed. Please note the findings as noted above.  We cleared the knee noted no obvious lateral meniscus tear but a gentle chondroplasty performed the lateral femoral condyle and lateral aspect of the trochlea.  Attention was turned to the proximal medial patella where a proximal medial patellar skin incision was made and carried down through the skin and subcutaneous tissue.  The medial border of the patella was exposed down to layer 3.  We tagged the superficial tissue which was consistent with the attenuated MPFL remnant.  The joint was not  entered.  We then used 2 - 2.4  mm arthrex suturetak anchor placed at the proximal 25% and 50% marks of the patella from proximal to distal transversely.  These would be used to hold our graft in place using a luggage loop type suture pass.    Our graft was prepped in the form of a doubled over semitendinosis that passed through a 6.27mm tunnel.   This was secured as above to the patella at its mid portion and the two loose tails were then passed under layer 2 to the medial epicondyle.  We then made a 3 cm approach starting at the medial epicondyle extending just proximal and posterior.  We took care to dissect the superficial tissues bluntly and used blunt retraction to ensure that the neurovascular structures were out of our field.   We identified the medial epicondyle.  Blunt dissection was performed below the fascia outside of the capsule from the medial patella to the adductor tubercle.    Using a Beath pin under fluoroscopy image intensification, the Beath pin was placed at [place] and placed from a posterior to anterior and distal to proximal direction exiting the lateral thigh.  Good position was noted on the fluoroscopic views.  The Beath pin and the adductor tubercle was over reamed with a 6.45mm cannulated reamer to the far lateral femoral cortex.  The sutures from the semitendinosus graft were then passed used the Beath pin exiting laterally.  With the knee in 30 degrees of flexion, the graft was appropriately tensioned to allow for appropriate medial lateral stability with approximately 74mm of lateral translation without being excessively tight.  Excellent tension was noted.  A guidepin was then placed in the femoral tunnel and the graft was secured using a 6.25 swivel lock Arthrex screw with excellent purchase noted and the medial patellofemoral ligament graft appropriately tensioned.  There was adequate medial lateral stability, but the patella was not excessively tight.  The arthroscope  was placed back in the joint to check position and translation of the patella before and after graft fixation noting it to be stable and articulating within the trochlea.  The native MPFL tissue was repaired at both its patellar and femoral origins in a pants over vest style fashion to imbricate this loose tissue with #2 fiberwire.  All incisions were irrigated copiously and vancomycin powder was placed prior to closure in a multilayer fashion with absorbable suture.  The patient was awoken from general anesthesia and taken to the PACU in stable condition without complication.   Noemi Chapel, PA-C, present and scrubbed throughout the case, critical for completion in a timely fashion, and for retraction, instrumentation, closure.

## 2019-12-22 NOTE — Anesthesia Preprocedure Evaluation (Addendum)
Anesthesia Evaluation  Patient identified by MRN, date of birth, ID band Patient awake    Reviewed: Allergy & Precautions, NPO status , Patient's Chart, lab work & pertinent test results  Airway Mallampati: II  TM Distance: >3 FB Neck ROM: Full    Dental  (+) Teeth Intact, Dental Advisory Given   Pulmonary asthma ,    Pulmonary exam normal breath sounds clear to auscultation       Cardiovascular Exercise Tolerance: Good negative cardio ROS Normal cardiovascular exam Rhythm:Regular Rate:Normal     Neuro/Psych PSYCHIATRIC DISORDERS Anxiety Depression negative neurological ROS     GI/Hepatic negative GI ROS, Neg liver ROS,   Endo/Other  Obesity   Renal/GU negative Renal ROS     Musculoskeletal negative musculoskeletal ROS (+) TEAR OF MENISCUS RIGHT KNEE,CHRONIC INSTABILITY OF KNEE   Abdominal   Peds  Hematology negative hematology ROS (+)   Anesthesia Other Findings Day of surgery medications reviewed with the patient.  Reproductive/Obstetrics negative OB ROS                            Anesthesia Physical Anesthesia Plan  ASA: II  Anesthesia Plan: General   Post-op Pain Management:  Regional for Post-op pain   Induction: Intravenous  PONV Risk Score and Plan: 3 and Midazolam, Dexamethasone and Ondansetron  Airway Management Planned: LMA  Additional Equipment:   Intra-op Plan:   Post-operative Plan: Extubation in OR  Informed Consent: I have reviewed the patients History and Physical, chart, labs and discussed the procedure including the risks, benefits and alternatives for the proposed anesthesia with the patient or authorized representative who has indicated his/her understanding and acceptance.     Dental advisory given  Plan Discussed with: CRNA  Anesthesia Plan Comments:         Anesthesia Quick Evaluation

## 2019-12-22 NOTE — Interval H&P Note (Signed)
History and Physical Interval Note:  12/22/2019 9:18 AM  Amber Braun  has presented today for surgery, with the diagnosis of TEAR OF MENISCUS RIGHT KNEE,CHRONIC INSTABILITY OF KNEE.  The various methods of treatment have been discussed with the patient and family. After consideration of risks, benefits and other options for treatment, the patient has consented to  Procedure(s): KNEE ARTHROSCOPY WITH LATERAL MENISECTOMY (Right) KNEE RECONSTRUCTION (Right) as a surgical intervention.  The patient's history has been reviewed, patient examined, no change in status, stable for surgery.  I have reviewed the patient's chart and labs.  Questions were answered to the patient's satisfaction.     Bjorn Pippin

## 2019-12-22 NOTE — Anesthesia Procedure Notes (Signed)
Procedure Name: LMA Insertion Date/Time: 12/22/2019 10:39 AM Performed by: Sheryn Bison, CRNA Pre-anesthesia Checklist: Patient identified, Emergency Drugs available, Suction available and Patient being monitored Patient Re-evaluated:Patient Re-evaluated prior to induction Oxygen Delivery Method: Circle system utilized Preoxygenation: Pre-oxygenation with 100% oxygen Induction Type: IV induction Ventilation: Mask ventilation without difficulty LMA: LMA inserted LMA Size: 4.0 Number of attempts: 1 Airway Equipment and Method: Bite block Placement Confirmation: positive ETCO2 Tube secured with: Tape Dental Injury: Teeth and Oropharynx as per pre-operative assessment

## 2019-12-22 NOTE — Progress Notes (Signed)
Assisted Dr. Turk with right, ultrasound guided, adductor canal block. Side rails up, monitors on throughout procedure. See vital signs in flow sheet. Tolerated Procedure well.  

## 2019-12-23 ENCOUNTER — Encounter: Payer: Self-pay | Admitting: *Deleted

## 2020-03-15 ENCOUNTER — Other Ambulatory Visit: Payer: Self-pay

## 2020-03-15 ENCOUNTER — Ambulatory Visit: Payer: BC Managed Care – PPO | Admitting: "Endocrinology

## 2020-03-15 DIAGNOSIS — L659 Nonscarring hair loss, unspecified: Secondary | ICD-10-CM

## 2020-04-25 ENCOUNTER — Ambulatory Visit: Payer: BC Managed Care – PPO | Admitting: "Endocrinology

## 2020-05-21 ENCOUNTER — Ambulatory Visit: Payer: BC Managed Care – PPO | Admitting: "Endocrinology

## 2020-07-21 IMAGING — RF DG KNEE COMPLETE 4+V*R*
1 series · 4 of 4 positions shown · non-contrast
Comparison: MRI 11/29/2019

CLINICAL DATA: Right knee arthroscopy patellofemoral reconstruction

EXAM:
RIGHT KNEE - COMPLETE 4+ VIEW; DG C-ARM 1-60 MIN

[Series 1: run · 4 of 4 slices shown]
[im 1/4]
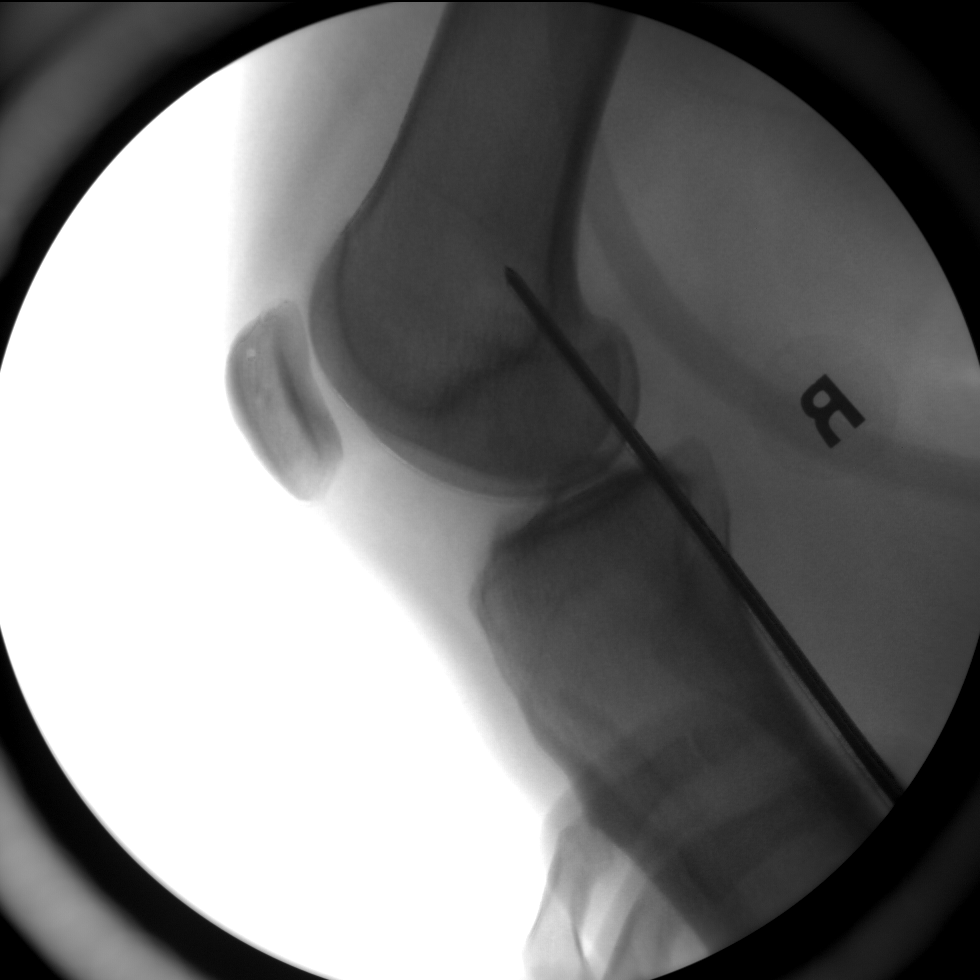
[im 2/4]
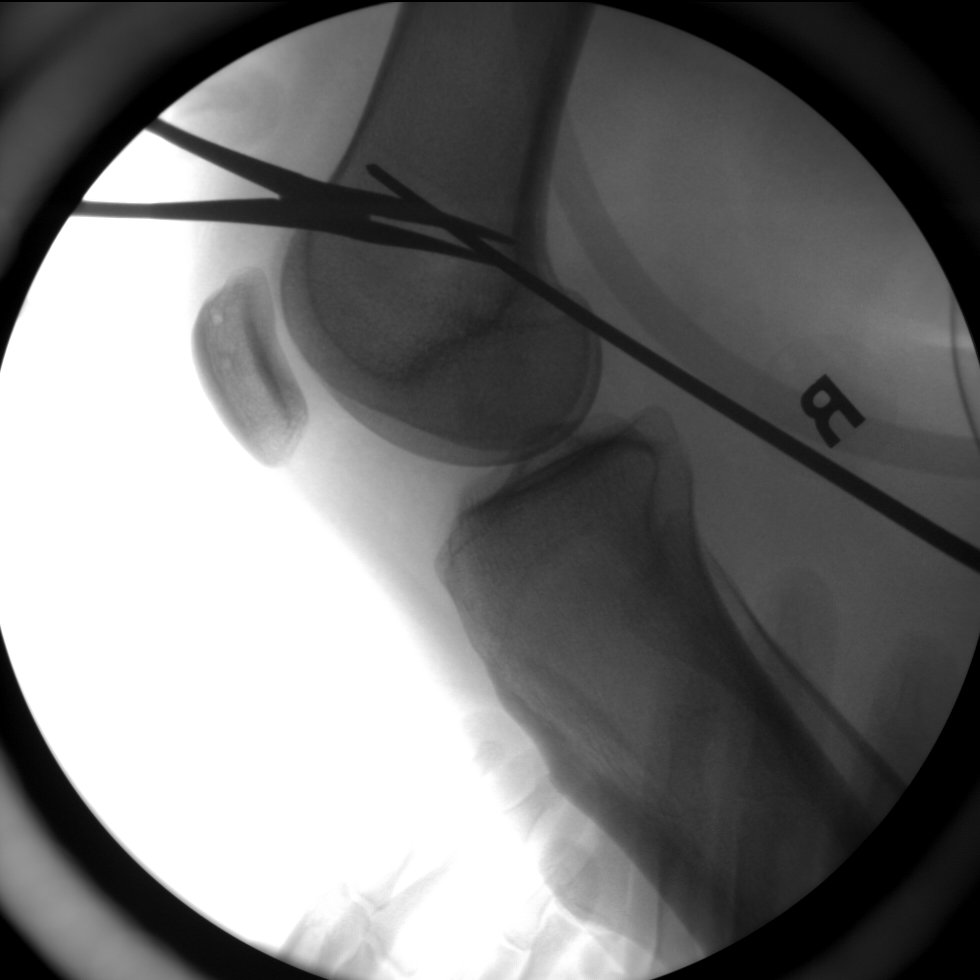
[im 3/4]
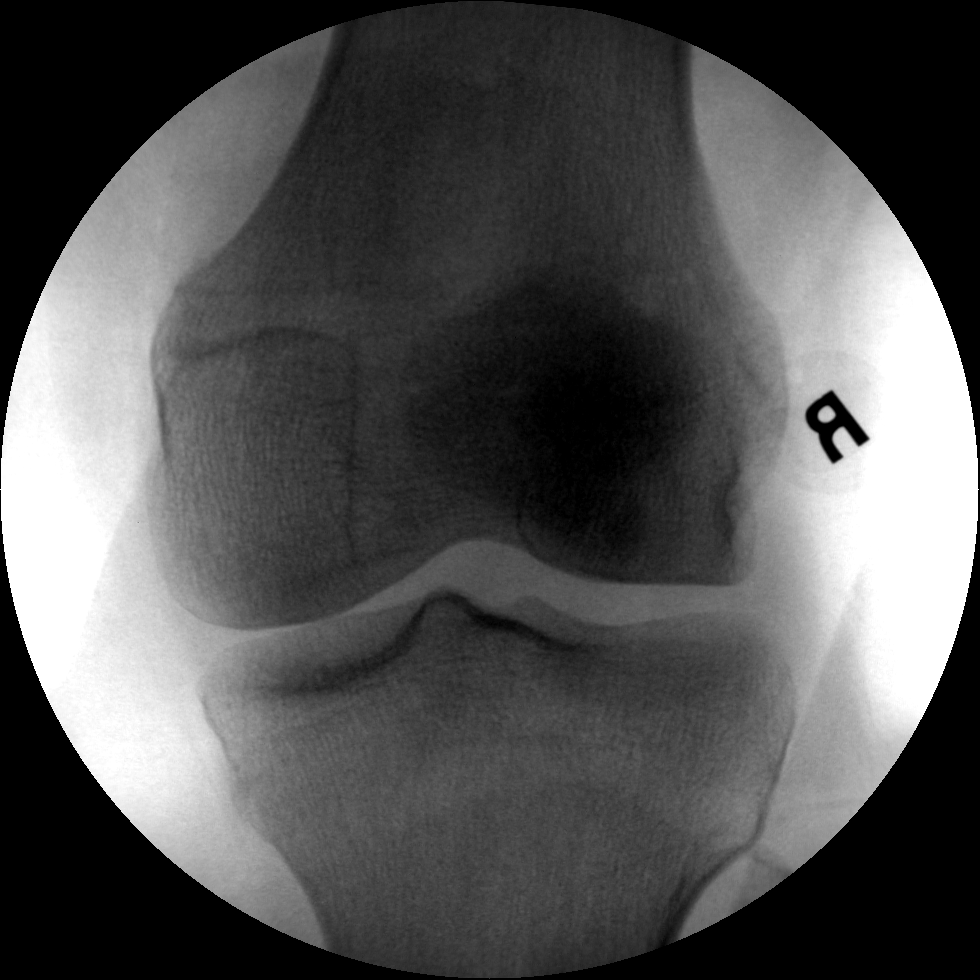
[im 4/4]
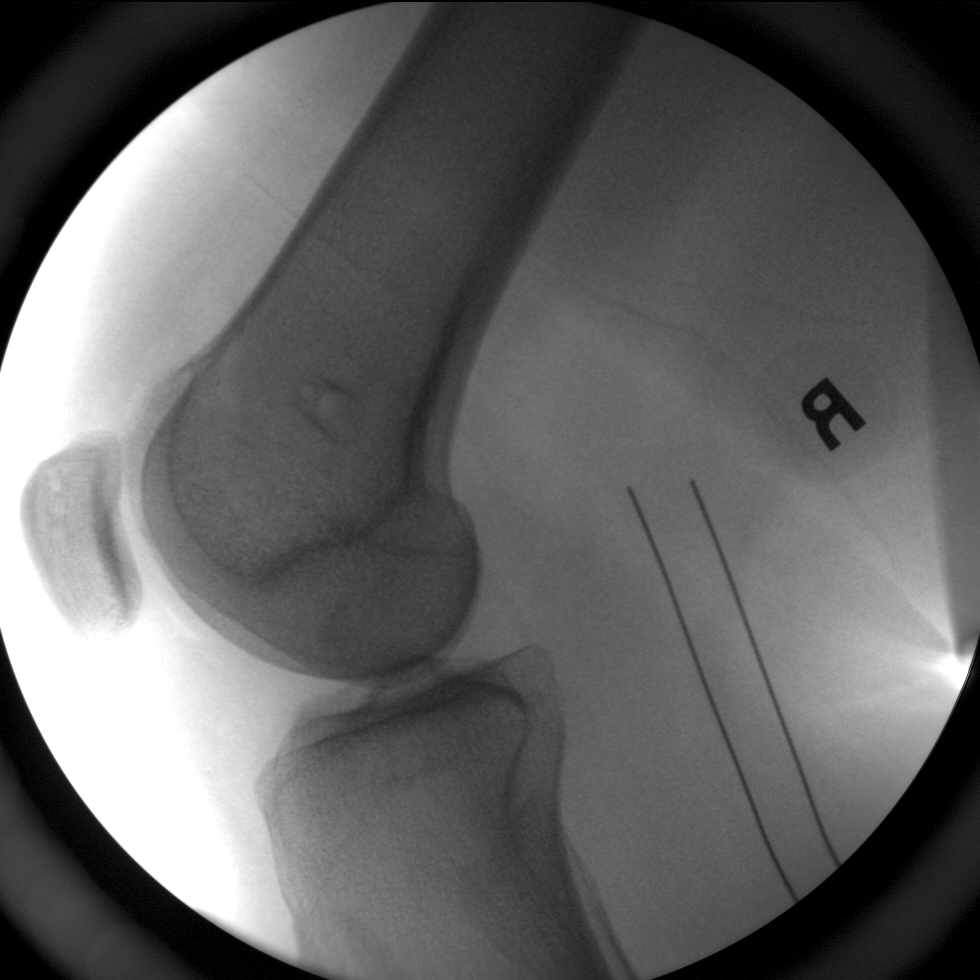

[4 of 4 positions shown; findings below may reference images not displayed]

FINDINGS: Four low resolution intraoperative spot views of the right knee.
Total fluoroscopy time was 11 seconds. Initial images demonstrate
surgical instruments over the distal femur. Apparent tunnel defect
in the distal femur on final view. Linear wires or instrument
posterior to the knee.
IMPRESSION: Intraoperative fluoroscopic assistance provided during right knee
surgery.

## 2021-04-15 ENCOUNTER — Encounter: Payer: Self-pay | Admitting: Pediatrics

## 2024-08-15 ENCOUNTER — Ambulatory Visit
Admission: EM | Admit: 2024-08-15 | Discharge: 2024-08-15 | Disposition: A | Discharge status: Home / Self Care | Payer: Self-pay

## 2024-08-15 DIAGNOSIS — G5701 Lesion of sciatic nerve, right lower limb: Secondary | ICD-10-CM

## 2024-08-15 MED ORDER — NAPROXEN 500 MG PO TABS: 500.0000 mg | ORAL_TABLET | Freq: Two times a day (BID) | ORAL | 0 refills | Status: AC | PRN

## 2024-08-15 MED ORDER — TIZANIDINE HCL 4 MG PO TABS: 4.0000 mg | ORAL_TABLET | Freq: Three times a day (TID) | ORAL | 0 refills | Status: AC | PRN

## 2024-08-15 NOTE — ED Triage Notes (Signed)
 Right lower back pain with hip/leg pain. Pt states the pain burns down her right leg x 2 days.  Taking ibuprofen with no relief of pain.

## 2024-08-15 NOTE — ED Provider Notes (Signed)
 [facility]    CSN: [id number] Arrival date & time: 08/15/24  1119      History   Chief Complaint Chief Complaint  Patient presents with   Hip Pain   Back Pain    HPI Amber Braun is a 23 y.o. female.   Patient presenting today with several day history of right sided lower back pain now extending into the hip and buttock and radiating some pain down the back of the right leg.  Denies known injury, numbness, tingling, weakness, edema, discoloration, bowel or bladder incontinence, saddle anesthesias, urinary symptoms, fevers.  So far trying ibuprofen with minimal relief.  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  LMP 08/15/2024.    Past Medical History:  Diagnosis Date   Adjustment disorder with mixed anxiety and depressed mood 02/17/2013   Alopecia    Asthma    Overweight(278.02) 02/17/2013   Seasonal allergic rhinitis 02/17/2013   Unspecified asthma(493.90) 02/17/2013    Patient Active Problem List   Diagnosis Date Noted   Hair loss 03/01/2019   Overweight 02/17/2013   Asthma 02/17/2013   Seasonal allergic rhinitis 02/17/2013    Past Surgical History:  Procedure Laterality Date   KNEE ARTHROSCOPY WITH LATERAL MENISECTOMY Right 12/22/2019   Procedure: KNEE ARTHROSCOPY WITH LATERAL MENISECTOMY;  Surgeon: Cristy Bonner DASEN, MD;  Location: [facility];  Service: Orthopedics;  Laterality: Right;   KNEE ARTHROSCOPY WITH MEDIAL PATELLAR FEMORAL LIGAMENT RECONSTRUCTION Right 12/22/2019   Procedure: KNEE ARTHROSCOPY WITH MEDIAL PATELLAR FEMORAL LIGAMENT RECONSTRUCTION;  Surgeon: Cristy Bonner DASEN, MD;  Location: [facility];  Service: Orthopedics;  Laterality: Right;    OB History   No obstetric history on file.      Home Medications    Prior to Admission medications   Medication Sig Start Date End Date Taking? Authorizing Provider  naproxen (NAPROSYN) 500 MG tablet Take 1 tablet (500  mg total) by mouth 2 (two) times daily as needed. 08/15/24  Yes Stuart Vernell Norris, PA-C  sertraline (ZOLOFT) 100 MG tablet TAKE 1 AND 1/2 TABLETS BY MOUTH DAILY. 14 DAYS PRIOR TO START OF CYCLE INCREASE DOSE TO 2 TABLETS DAILY UNTIL THE FIRST DAY OF CYCLE 10/21/23  Yes [provider]  spironolactone  (ALDACTONE ) 25 MG tablet Take 1 tablet (25 mg total) by mouth daily. 03/16/19  Yes Nida, Gebreselassie W, MD  tiZANidine (ZANAFLEX) 4 MG tablet Take 1 tablet (4 mg total) by mouth every 8 (eight) hours as needed for muscle spasms. Do not to drink alcohol or drive while taking this medication.  May cause drowsiness. 08/15/24  Yes Stuart Vernell Norris, PA-C  ALAYCEN 1/35 tablet daily. 02/15/19   [provider]  ferrous sulfate 325 (65 FE) MG EC tablet Take 325 mg by mouth daily with breakfast.    [provider]  fexofenadine (ALLEGRA) 180 MG tablet Take 180 mg by mouth daily.    [provider]    Family History Family History  Problem Relation Age of Onset   Hypertension Maternal Grandmother    Hypertension Maternal Grandfather    Cancer Maternal Grandfather    Diabetes Paternal Grandmother    Heart disease Paternal Grandmother    Heart disease Paternal Grandfather     Social History Social History   Tobacco Use   Smoking status: Never   Smokeless tobacco: Never  Vaping Use   Vaping status: Never Used  Substance Use Topics  Alcohol use: No   Drug use: Never     Allergies   Patient has no known allergies.   Review of Systems Review of Systems PER HPI  Physical Exam Triage Vital Signs ED Triage Vitals [08/15/24 1139]  Encounter Vitals Group     BP 117/67     Girls Systolic BP Percentile      Girls Diastolic BP Percentile      Boys Systolic BP Percentile      Boys Diastolic BP Percentile      Pulse Rate 88     Resp 16     Temp 98.5 F (36.9 C)     Temp Source Oral     SpO2 98 %     Weight      Height      Head Circumference       Peak Flow      Pain Score 4     Pain Loc      Pain Education      Exclude from Growth Chart    No data found.  Updated Vital Signs BP 117/67 (BP Location: Right Arm)   Pulse 88   Temp 98.5 F (36.9 C) (Oral)   Resp 16   LMP 08/15/2024 (Exact Date)   SpO2 98%   Visual Acuity Right Eye Distance:   Left Eye Distance:   Bilateral Distance:    Right Eye Near:   Left Eye Near:    Bilateral Near:     Physical Exam Vitals and nursing note reviewed.  Constitutional:      Appearance: Normal appearance. She is not ill-appearing.  HENT:     Head: Atraumatic.  Eyes:     Extraocular Movements: Extraocular movements intact.     Conjunctiva/sclera: Conjunctivae normal.  Cardiovascular:     Rate and Rhythm: Normal rate.  Pulmonary:     Effort: Pulmonary effort is normal.  Musculoskeletal:        General: Tenderness present. No swelling or signs of injury. Normal range of motion.     Cervical back: Normal range of motion and neck supple.     Comments: No midline spinal tenderness to palpation diffusely.  Negative straight leg raise bilateral lower extremities.  Normal gait and range of motion.  Tenderness to palpation over the right buttock and lateral hip and upper leg.  Skin:    General: Skin is warm and dry.     Findings: No bruising or erythema.  Neurological:     Mental Status: She is alert and oriented to person, place, and time.  Psychiatric:        Mood and Affect: Mood normal.        Thought Content: Thought content normal.        Judgment: Judgment normal.      UC Treatments / Results  Labs (all labs ordered are listed, but only abnormal results are displayed) Labs Reviewed - No data to display  EKG   Radiology No results found.  Procedures Procedures (including critical care time)  Medications Ordered in UC Medications - No data to display  Initial Impression / Assessment and Plan / UC Course  I have reviewed the triage vital signs and the  nursing notes.  Pertinent labs & imaging results that were available during my care of the patient were reviewed by me and considered in my medical decision making (see chart for details).     Suspect piriformis syndrome causing some sciatica.  Will treat with naproxen, Zanaflex,  heat,/, stretches, rest.  Return for worsening or unresolving symptoms.  Final Clinical Impressions(s) / UC Diagnoses   Final diagnoses:  Piriformis syndrome of right side   Discharge Instructions   None    ED Prescriptions     Medication Sig Dispense Auth. Provider   naproxen (NAPROSYN) 500 MG tablet Take 1 tablet (500 mg total) by mouth 2 (two) times daily as needed. 20 tablet Stuart Vernell Norris, PA-C   tiZANidine (ZANAFLEX) 4 MG tablet Take 1 tablet (4 mg total) by mouth every 8 (eight) hours as needed for muscle spasms. Do not to drink alcohol or drive while taking this medication.  May cause drowsiness. 15 tablet Stuart Vernell Norris, [place]      PDMP not reviewed this encounter.   Stuart Vernell Norris, [place] 08/15/24 1201

## 2024-10-03 ENCOUNTER — Ambulatory Visit
Admission: EM | Admit: 2024-10-03 | Discharge: 2024-10-03 | Disposition: A | Discharge status: Home / Self Care | Attending: Nurse Practitioner | Admitting: Nurse Practitioner

## 2024-10-03 DIAGNOSIS — R6889 Other general symptoms and signs: Secondary | ICD-10-CM

## 2024-10-03 LAB — POC SOFIA SARS ANTIGEN FIA: SARS Coronavirus 2 Ag: NEGATIVE

## 2024-10-03 LAB — POCT INFLUENZA A/B
Influenza A, POC: NEGATIVE
Influenza B, POC: NEGATIVE

## 2024-10-03 MED ORDER — OSELTAMIVIR PHOSPHATE 75 MG PO CAPS: 75.0000 mg | ORAL_CAPSULE | Freq: Two times a day (BID) | ORAL | 0 refills | Status: AC

## 2024-10-03 MED ORDER — PROMETHAZINE-DM 6.25-15 MG/5ML PO SYRP: 5.0000 mL | ORAL_SOLUTION | Freq: Four times a day (QID) | ORAL | 0 refills | Status: AC | PRN

## 2024-10-03 MED ORDER — FLUTICASONE PROPIONATE 50 MCG/ACT NA SUSP: 2.0000 | Freq: Every day | NASAL | 0 refills | Status: AC

## 2024-10-03 NOTE — ED Triage Notes (Signed)
 Vomiting, headache, cough, nausea, fever today of 102, chills x 4 days. Taking Advil cold and sinus.

## 2024-10-03 NOTE — ED Provider Notes (Signed)
 " [facility]    CSN: [id number] Arrival date & time: 10/03/24  1552      History   Chief Complaint Chief Complaint  Patient presents with   Emesis   Cough    HPI Amber Braun is a 23 y.o. female.   The history is provided by the patient.   Patient presents for complaints of vomiting, headache, cough, nausea, fever, and chills.  Patient states the GI symptoms started 4 days ago, with the fever, headache, cough, and chills starting over the past 24 hours.  States the GI symptoms have since improved.  She denies ear pain, ear drainage, wheezing, difficulty breathing, abdominal pain, nausea, vomiting, diarrhea, or rash.  She denies any obvious close sick contacts.  States she has been taking over-the-counter medications for her symptoms.  Past Medical History:  Diagnosis Date   Adjustment disorder with mixed anxiety and depressed mood 02/17/2013   Alopecia    Asthma    Overweight(278.02) 02/17/2013   Seasonal allergic rhinitis 02/17/2013   Unspecified asthma(493.90) 02/17/2013    Patient Active Problem List   Diagnosis Date Noted   Hair loss 03/01/2019   Overweight 02/17/2013   Asthma 02/17/2013   Seasonal allergic rhinitis 02/17/2013    Past Surgical History:  Procedure Laterality Date   KNEE ARTHROSCOPY WITH LATERAL MENISECTOMY Right 12/22/2019   Procedure: KNEE ARTHROSCOPY WITH LATERAL MENISECTOMY;  Surgeon: Cristy Bonner DASEN, MD;  Location: [facility];  Service: Orthopedics;  Laterality: Right;   KNEE ARTHROSCOPY WITH MEDIAL PATELLAR FEMORAL LIGAMENT RECONSTRUCTION Right 12/22/2019   Procedure: KNEE ARTHROSCOPY WITH MEDIAL PATELLAR FEMORAL LIGAMENT RECONSTRUCTION;  Surgeon: Cristy Bonner DASEN, MD;  Location: [facility];  Service: Orthopedics;  Laterality: Right;    OB History   No obstetric history on file.      Home Medications    Prior to Admission medications  Medication Sig Start Date End Date Taking? Authorizing Provider   fluticasone (FLONASE) 50 MCG/ACT nasal spray Place 2 sprays into both nostrils daily. 10/03/24  Yes Leath-Warren, Etta PARAS, NP  oseltamivir  (TAMIFLU ) 75 MG capsule Take 1 capsule (75 mg total) by mouth every 12 (twelve) hours. 10/03/24  Yes Leath-Warren, Etta PARAS, NP  promethazine -dextromethorphan (PROMETHAZINE -DM) 6.25-15 MG/5ML syrup Take 5 mLs by mouth 4 (four) times daily as needed. 10/03/24  Yes Leath-Warren, Etta PARAS, NP  sertraline (ZOLOFT) 100 MG tablet TAKE 1 AND 1/2 TABLETS BY MOUTH DAILY. 14 DAYS PRIOR TO START OF CYCLE INCREASE DOSE TO 2 TABLETS DAILY UNTIL THE FIRST DAY OF CYCLE 10/21/23  Yes [provider]  spironolactone  (ALDACTONE ) 25 MG tablet Take 1 tablet (25 mg total) by mouth daily. 03/16/19  Yes Nida, Gebreselassie W, MD  ALAYCEN 1/35 tablet daily. 02/15/19   [provider]  ferrous sulfate 325 (65 FE) MG EC tablet Take 325 mg by mouth daily with breakfast.    [provider]  fexofenadine (ALLEGRA) 180 MG tablet Take 180 mg by mouth daily.    [provider]  naproxen  (NAPROSYN ) 500 MG tablet Take 1 tablet (500 mg total) by mouth 2 (two) times daily as needed. 08/15/24   Stuart Vernell Norris, PA-C  tiZANidine  (ZANAFLEX ) 4 MG tablet Take 1 tablet (4 mg total) by mouth every 8 (eight) hours as needed for muscle spasms. Do not to drink alcohol or drive while taking this medication.  May cause drowsiness. 08/15/24   Stuart Vernell Norris, PA-C    Family History Family History  Problem Relation  Age of Onset   Hypertension Maternal Grandmother    Hypertension Maternal Grandfather    Cancer Maternal Grandfather    Diabetes Paternal Grandmother    Heart disease Paternal Grandmother    Heart disease Paternal Grandfather     Social History Social History[1]   Allergies   Patient has no known allergies.   Review of Systems Review of Systems Per HPI  Physical Exam Triage Vital Signs ED Triage Vitals [10/03/24 1822]   Encounter Vitals Group     BP 127/76     Girls Systolic BP Percentile      Girls Diastolic BP Percentile      Boys Systolic BP Percentile      Boys Diastolic BP Percentile      Pulse Rate (!) 111     Resp 16     Temp (!) 100.6 F (38.1 C)     Temp Source Oral     SpO2 96 %     Weight      Height      Head Circumference      Peak Flow      Pain Score 5     Pain Loc      Pain Education      Exclude from Growth Chart    No data found.  Updated Vital Signs BP 127/76 (BP Location: Right Arm)   Pulse (!) 111   Temp (!) 100.6 F (38.1 C) (Oral)   Resp 16   LMP 09/24/2024 (Exact Date)   SpO2 96%   Visual Acuity Right Eye Distance:   Left Eye Distance:   Bilateral Distance:    Right Eye Near:   Left Eye Near:    Bilateral Near:     Physical Exam Vitals and nursing note reviewed.  Constitutional:      General: She is not in acute distress.    Appearance: Normal appearance. She is well-developed.  HENT:     Head: Normocephalic and atraumatic.     Right Ear: Tympanic membrane, ear canal and external ear normal.     Left Ear: Tympanic membrane, ear canal and external ear normal.     Nose: Congestion present.     Right Turbinates: Enlarged and swollen.     Left Turbinates: Enlarged and swollen.     Right Sinus: No maxillary sinus tenderness or frontal sinus tenderness.     Left Sinus: No maxillary sinus tenderness or frontal sinus tenderness.     Mouth/Throat:     Lips: Pink.     Mouth: Mucous membranes are moist.     Pharynx: Uvula midline. Postnasal drip present. No pharyngeal swelling, oropharyngeal exudate, posterior oropharyngeal erythema or uvula swelling.     Comments: Cobblestoning present to posterior oropharynx  Eyes:     Extraocular Movements: Extraocular movements intact.     Conjunctiva/sclera: Conjunctivae normal.     Pupils: Pupils are equal, round, and reactive to light.  Neck:     Thyroid: No thyromegaly.     Trachea: No tracheal deviation.   Cardiovascular:     Rate and Rhythm: Normal rate and regular rhythm.     Pulses: Normal pulses.     Heart sounds: Normal heart sounds.  Pulmonary:     Effort: Pulmonary effort is normal. No respiratory distress.     Breath sounds: Normal breath sounds. No stridor. No wheezing, rhonchi or rales.  Abdominal:     General: Bowel sounds are normal.     Palpations: Abdomen is soft.  Tenderness: There is no abdominal tenderness.  Musculoskeletal:     Cervical back: Normal range of motion and neck supple.  Skin:    General: Skin is warm and dry.  Neurological:     General: No focal deficit present.     Mental Status: She is alert and oriented to person, place, and time.  Psychiatric:        Mood and Affect: Mood normal.        Behavior: Behavior normal.        Thought Content: Thought content normal.        Judgment: Judgment normal.      UC Treatments / Results  Labs (all labs ordered are listed, but only abnormal results are displayed) Labs Reviewed  POCT INFLUENZA A/B - Normal  POC SOFIA SARS ANTIGEN FIA    EKG   Radiology No results found.  Procedures Procedures (including critical care time)  Medications Ordered in UC Medications - No data to display  Initial Impression / Assessment and Plan / UC Course  I have reviewed the triage vital signs and the nursing notes.  Pertinent labs & imaging results that were available during my care of the patient were reviewed by me and considered in my medical decision making (see chart for details).  The influenza test and COVID test were negative.  Patient is febrile currently.  On exam, lung sounds are clear throughout, room air sats are at 96%.  Despite the negative influenza test, we will go ahead and treat patient with Tamiflu  for her flulike symptoms.  Symptomatic treatment provided with Promethazine  DM for the cough and fluticasone 50 mcg nasal spray for nasal congestion and postnasal drainage.  Supportive care  recommendations were provided and discussed with the patient to include fluids, rest, over-the-counter analgesics, use of a humidifier during sleep, and sleeping elevated.  Discussed indications with the patient regarding follow-up.  Patient was in agreement with this plan of care and verbalizes understanding.  All questions were answered.  Patient stable for discharge.  Work note was provided.   Final Clinical Impressions(s) / UC Diagnoses   Final diagnoses:  Flu-like symptoms     Discharge Instructions      The COVID test and influenza test were negative. Take medication as prescribed.  I have elected to begin Tamiflu  despite your negative influenza test based on your current symptoms. You may take over-the-counter Tylenol  or ibuprofen as needed for pain, fever, or general discomfort. Recommend the use of normal saline nasal spray throughout the day for nasal congestion or runny nose. For the cough, recommend the use of a humidifier in your bedroom at nighttime during sleep and sleeping elevated on pillows while symptoms persist. You should remain home until you have been fever free for 24 hours with no medication. Symptoms should begin to improve over the next 5 to 7 days.  If symptoms fail to improve, or begin to worsen, you may follow-up in this clinic or with your primary care physician for further evaluation. Follow-up as needed.     ED Prescriptions     Medication Sig Dispense Auth. Provider   oseltamivir  (TAMIFLU ) 75 MG capsule Take 1 capsule (75 mg total) by mouth every 12 (twelve) hours. 10 capsule Leath-Warren, Etta PARAS, NP   promethazine -dextromethorphan (PROMETHAZINE -DM) 6.25-15 MG/5ML syrup Take 5 mLs by mouth 4 (four) times daily as needed. 118 mL Leath-Warren, Etta PARAS, NP   fluticasone (FLONASE) 50 MCG/ACT nasal spray Place 2 sprays into both nostrils daily. 16  g Leath-Warren, Etta PARAS, NP      PDMP not reviewed this encounter.     [1]  Social  History Tobacco Use   Smoking status: Never   Smokeless tobacco: Never  Vaping Use   Vaping status: Never Used  Substance Use Topics   Alcohol use: No   Drug use: Never     Gilmer Etta PARAS, NP 10/03/24 1852  "

## 2024-10-03 NOTE — Discharge Instructions (Addendum)
 The COVID test and influenza test were negative. Take medication as prescribed.  I have elected to begin Tamiflu  despite your negative influenza test based on your current symptoms. You may take over-the-counter Tylenol  or ibuprofen as needed for pain, fever, or general discomfort. Recommend the use of normal saline nasal spray throughout the day for nasal congestion or runny nose. For the cough, recommend the use of a humidifier in your bedroom at nighttime during sleep and sleeping elevated on pillows while symptoms persist. You should remain home until you have been fever free for 24 hours with no medication. Symptoms should begin to improve over the next 5 to 7 days.  If symptoms fail to improve, or begin to worsen, you may follow-up in this clinic or with your primary care physician for further evaluation. Follow-up as needed.

## 2024-10-05 ENCOUNTER — Ambulatory Visit
Admission: EM | Admit: 2024-10-05 | Discharge: 2024-10-05 | Disposition: A | Discharge status: Home / Self Care | Attending: Family Medicine | Admitting: Family Medicine

## 2024-10-05 ENCOUNTER — Ambulatory Visit: Payer: Self-pay

## 2024-10-05 DIAGNOSIS — J029 Acute pharyngitis, unspecified: Secondary | ICD-10-CM | POA: Insufficient documentation

## 2024-10-05 DIAGNOSIS — J069 Acute upper respiratory infection, unspecified: Secondary | ICD-10-CM | POA: Diagnosis not present

## 2024-10-05 LAB — POCT RAPID STREP A (OFFICE): Rapid Strep A Screen: NEGATIVE

## 2024-10-05 MED ORDER — LIDOCAINE VISCOUS HCL 2 % MT SOLN: 10.0000 mL | OROMUCOSAL | 0 refills | Status: AC | PRN

## 2024-10-05 NOTE — ED Triage Notes (Signed)
 Pt states sore throat states she was seen here 2 days ago for flu like symptoms and she was put on Tamilfu.  States she is not feeling any better and her sore throat is worse.

## 2024-10-06 NOTE — ED Provider Notes (Signed)
 " [facility]    CSN: [id number] Arrival date & time: 10/05/24  1024      History   Chief Complaint Chief Complaint  Patient presents with   Sore Throat    HPI Amber Braun is a 24 y.o. female.   Patient presenting today with 3-day history of worsening sore throat, congestion, cough, fatigue.  Was seen 2 days ago and diagnosed with flulike illness, placed on Tamiflu  but notes her symptoms are worsening and she would like to be tested for strep.  Her COVID and flu test was negative at that visit, she states she was not tested for strep at that time.  Denies difficulty breathing or swallowing, chest pain, shortness of breath, abdominal pain, vomiting, diarrhea.  Trying over-the-counter remedies and the Tamiflu  with minimal relief.    Past Medical History:  Diagnosis Date   Adjustment disorder with mixed anxiety and depressed mood 02/17/2013   Alopecia    Asthma    Overweight(278.02) 02/17/2013   Seasonal allergic rhinitis 02/17/2013   Unspecified asthma(493.90) 02/17/2013    Patient Active Problem List   Diagnosis Date Noted   Hair loss 03/01/2019   Overweight 02/17/2013   Asthma 02/17/2013   Seasonal allergic rhinitis 02/17/2013    Past Surgical History:  Procedure Laterality Date   KNEE ARTHROSCOPY WITH LATERAL MENISECTOMY Right 12/22/2019   Procedure: KNEE ARTHROSCOPY WITH LATERAL MENISECTOMY;  Surgeon: Cristy Bonner DASEN, MD;  Location: [facility];  Service: Orthopedics;  Laterality: Right;   KNEE ARTHROSCOPY WITH MEDIAL PATELLAR FEMORAL LIGAMENT RECONSTRUCTION Right 12/22/2019   Procedure: KNEE ARTHROSCOPY WITH MEDIAL PATELLAR FEMORAL LIGAMENT RECONSTRUCTION;  Surgeon: Cristy Bonner DASEN, MD;  Location: [facility];  Service: Orthopedics;  Laterality: Right;    OB History   No obstetric history on file.      Home Medications    Prior to Admission medications  Medication Sig Start Date End Date Taking? Authorizing Provider   lidocaine  (XYLOCAINE ) 2 % solution Use as directed 10 mLs in the mouth or throat every 3 (three) hours as needed. 10/05/24  Yes Stuart Vernell Norris, PA-C  ALAYCEN 1/35 tablet daily. 02/15/19   [provider]  ferrous sulfate 325 (65 FE) MG EC tablet Take 325 mg by mouth daily with breakfast.    [provider]  fexofenadine (ALLEGRA) 180 MG tablet Take 180 mg by mouth daily.    [provider]  fluticasone (FLONASE) 50 MCG/ACT nasal spray Place 2 sprays into both nostrils daily. 10/03/24   Leath-Warren, Etta PARAS, NP  naproxen  (NAPROSYN ) 500 MG tablet Take 1 tablet (500 mg total) by mouth 2 (two) times daily as needed. 08/15/24   Stuart Vernell Norris, PA-C  oseltamivir  (TAMIFLU ) 75 MG capsule Take 1 capsule (75 mg total) by mouth every 12 (twelve) hours. 10/03/24   Leath-Warren, Etta PARAS, NP  promethazine -dextromethorphan (PROMETHAZINE -DM) 6.25-15 MG/5ML syrup Take 5 mLs by mouth 4 (four) times daily as needed. 10/03/24   Leath-Warren, Etta PARAS, NP  sertraline (ZOLOFT) 100 MG tablet TAKE 1 AND 1/2 TABLETS BY MOUTH DAILY. 14 DAYS PRIOR TO START OF CYCLE INCREASE DOSE TO 2 TABLETS DAILY UNTIL THE FIRST DAY OF CYCLE 10/21/23   [provider]  spironolactone  (ALDACTONE ) 25 MG tablet Take 1 tablet (25 mg total) by mouth daily. 03/16/19   Nida, Gebreselassie W, MD  tiZANidine  (ZANAFLEX ) 4 MG tablet Take 1 tablet (4 mg total) by mouth every 8 (eight) hours as needed for muscle spasms. Do not  to drink alcohol or drive while taking this medication.  May cause drowsiness. 08/15/24   Stuart Vernell Norris, PA-C    Family History Family History  Problem Relation Age of Onset   Hypertension Maternal Grandmother    Hypertension Maternal Grandfather    Cancer Maternal Grandfather    Diabetes Paternal Grandmother    Heart disease Paternal Grandmother    Heart disease Paternal Grandfather     Social History Social History[1]   Allergies   Patient has no  known allergies.   Review of Systems Review of Systems PER HPI  Physical Exam Triage Vital Signs ED Triage Vitals  Encounter Vitals Group     BP 10/05/24 1121 106/75     Girls Systolic BP Percentile --      Girls Diastolic BP Percentile --      Boys Systolic BP Percentile --      Boys Diastolic BP Percentile --      Pulse Rate 10/05/24 1121 91     Resp 10/05/24 1121 16     Temp 10/05/24 1121 99.3 F (37.4 C)     Temp Source 10/05/24 1121 Oral     SpO2 10/05/24 1121 96 %     Weight --      Height --      Head Circumference --      Peak Flow --      Pain Score 10/05/24 1120 8     Pain Loc --      Pain Education --      Exclude from Growth Chart --    No data found.  Updated Vital Signs BP 106/75 (BP Location: Right Arm)   Pulse 91   Temp 99.3 F (37.4 C) (Oral)   Resp 16   LMP 09/24/2024 (Exact Date)   SpO2 96%   Visual Acuity Right Eye Distance:   Left Eye Distance:   Bilateral Distance:    Right Eye Near:   Left Eye Near:    Bilateral Near:     Physical Exam Vitals and nursing note reviewed.  Constitutional:      Appearance: Normal appearance.  HENT:     Head: Atraumatic.     Right Ear: Tympanic membrane and external ear normal.     Left Ear: Tympanic membrane and external ear normal.     Nose: Rhinorrhea present.     Mouth/Throat:     Mouth: Mucous membranes are moist.     Pharynx: Posterior oropharyngeal erythema present. No oropharyngeal exudate.  Eyes:     Extraocular Movements: Extraocular movements intact.     Conjunctiva/sclera: Conjunctivae normal.  Cardiovascular:     Rate and Rhythm: Normal rate and regular rhythm.     Heart sounds: Normal heart sounds.  Pulmonary:     Effort: Pulmonary effort is normal.     Breath sounds: Normal breath sounds. No wheezing.  Musculoskeletal:        General: Normal range of motion.     Cervical back: Normal range of motion and neck supple.  Lymphadenopathy:     Cervical: No cervical adenopathy.   Skin:    General: Skin is warm and dry.  Neurological:     Mental Status: She is alert and oriented to person, place, and time.  Psychiatric:        Mood and Affect: Mood normal.        Thought Content: Thought content normal.      UC Treatments / Results  Labs (all labs ordered  are listed, but only abnormal results are displayed) Labs Reviewed  CULTURE, GROUP A STREP [facility])  POCT RAPID STREP A (OFFICE)    EKG   Radiology No results found.  Procedures Procedures (including critical care time)  Medications Ordered in UC Medications - No data to display  Initial Impression / Assessment and Plan / UC Course  I have reviewed the triage vital signs and the nursing notes.  Pertinent labs & imaging results that were available during my care of the patient were reviewed by me and considered in my medical decision making (see chart for details).     Vitals and exam reassuring today, rapid strep negative, throat culture pending for further evaluation.  Viral testing was done 2 days ago and negative for COVID and flu.  Suspect ongoing viral upper respiratory infection.  Add viscous lidocaine  to current medication regimen.  Supportive home care and return precautions reviewed.  Final Clinical Impressions(s) / UC Diagnoses   Final diagnoses:  Sore throat  Viral URI with cough   Discharge Instructions   None    ED Prescriptions     Medication Sig Dispense Auth. Provider   lidocaine  (XYLOCAINE ) 2 % solution Use as directed 10 mLs in the mouth or throat every 3 (three) hours as needed. 100 mL Stuart Vernell Norris, [place]      PDMP not reviewed this encounter.    [1]  Social History Tobacco Use   Smoking status: Never   Smokeless tobacco: Never  Vaping Use   Vaping status: Never Used  Substance Use Topics   Alcohol use: No   Drug use: Never     Stuart Vernell Norris, PA-C 10/06/24 0827  "

## 2024-10-09 LAB — CULTURE, GROUP A STREP (THRC)

## 2024-10-10 ENCOUNTER — Ambulatory Visit (HOSPITAL_COMMUNITY): Payer: Self-pay
